# Patient Record
Sex: Female | Born: 1965 | Race: Black or African American | Hispanic: No | Marital: Married | Smoking: Never smoker
Health system: Southern US, Community
[De-identification: ages and names within clinical notes are randomized; demographics above are authoritative.]

## PROBLEM LIST (undated history)

## (undated) DIAGNOSIS — J45909 Unspecified asthma, uncomplicated: Secondary | ICD-10-CM

## (undated) HISTORY — PX: HERNIA REPAIR: SHX51

## (undated) HISTORY — DX: Unspecified asthma, uncomplicated: J45.909

## (undated) HISTORY — PX: DILATION AND CURETTAGE, DIAGNOSTIC / THERAPEUTIC: SUR384

---

## 1999-05-20 ENCOUNTER — Emergency Department (HOSPITAL_COMMUNITY): Admission: EM | Admit: 1999-05-20 | Discharge: 1999-05-20 | Payer: Self-pay | Admitting: *Deleted

## 1999-06-20 ENCOUNTER — Encounter: Payer: Self-pay | Admitting: Emergency Medicine

## 1999-06-20 ENCOUNTER — Inpatient Hospital Stay (HOSPITAL_COMMUNITY): Admission: EM | Admit: 1999-06-20 | Discharge: 1999-06-22 | Payer: Self-pay | Admitting: Emergency Medicine

## 1999-06-21 ENCOUNTER — Encounter (HOSPITAL_BASED_OUTPATIENT_CLINIC_OR_DEPARTMENT_OTHER): Payer: Self-pay | Admitting: Internal Medicine

## 2001-03-04 ENCOUNTER — Other Ambulatory Visit: Admission: RE | Admit: 2001-03-04 | Discharge: 2001-03-04 | Payer: Self-pay | Admitting: Family Medicine

## 2004-01-07 ENCOUNTER — Encounter (INDEPENDENT_AMBULATORY_CARE_PROVIDER_SITE_OTHER): Payer: Self-pay | Admitting: Specialist

## 2004-01-07 ENCOUNTER — Ambulatory Visit (HOSPITAL_COMMUNITY): Admission: AD | Admit: 2004-01-07 | Discharge: 2004-01-07 | Payer: Self-pay | Admitting: Gynecology

## 2004-01-07 ENCOUNTER — Encounter: Payer: Self-pay | Admitting: Emergency Medicine

## 2004-02-26 ENCOUNTER — Ambulatory Visit: Payer: Self-pay | Admitting: *Deleted

## 2004-03-07 ENCOUNTER — Ambulatory Visit: Payer: Self-pay | Admitting: Family Medicine

## 2004-04-10 ENCOUNTER — Ambulatory Visit: Payer: Self-pay | Admitting: Family Medicine

## 2004-05-22 ENCOUNTER — Ambulatory Visit: Payer: Self-pay | Admitting: Family Medicine

## 2004-05-23 ENCOUNTER — Emergency Department (HOSPITAL_COMMUNITY): Admission: EM | Admit: 2004-05-23 | Discharge: 2004-05-23 | Payer: Self-pay | Admitting: Emergency Medicine

## 2004-07-04 ENCOUNTER — Ambulatory Visit (HOSPITAL_COMMUNITY): Admission: RE | Admit: 2004-07-04 | Discharge: 2004-07-04 | Payer: Self-pay | Admitting: Obstetrics & Gynecology

## 2004-07-11 ENCOUNTER — Ambulatory Visit (HOSPITAL_COMMUNITY): Admission: RE | Admit: 2004-07-11 | Discharge: 2004-07-11 | Payer: Self-pay | Admitting: Obstetrics & Gynecology

## 2004-07-12 ENCOUNTER — Ambulatory Visit (HOSPITAL_COMMUNITY): Admission: RE | Admit: 2004-07-12 | Discharge: 2004-07-12 | Payer: Self-pay | Admitting: Obstetrics & Gynecology

## 2004-07-12 ENCOUNTER — Ambulatory Visit: Payer: Self-pay | Admitting: Obstetrics & Gynecology

## 2004-07-12 ENCOUNTER — Encounter (INDEPENDENT_AMBULATORY_CARE_PROVIDER_SITE_OTHER): Payer: Self-pay | Admitting: *Deleted

## 2004-07-27 ENCOUNTER — Inpatient Hospital Stay (HOSPITAL_COMMUNITY): Admission: AD | Admit: 2004-07-27 | Discharge: 2004-07-27 | Payer: Self-pay | Admitting: Obstetrics and Gynecology

## 2004-07-30 ENCOUNTER — Ambulatory Visit: Payer: Self-pay | Admitting: Obstetrics & Gynecology

## 2004-08-13 ENCOUNTER — Ambulatory Visit: Payer: Self-pay | Admitting: *Deleted

## 2004-09-19 ENCOUNTER — Inpatient Hospital Stay (HOSPITAL_COMMUNITY): Admission: AD | Admit: 2004-09-19 | Discharge: 2004-09-19 | Payer: Self-pay | Admitting: Internal Medicine

## 2004-11-14 ENCOUNTER — Inpatient Hospital Stay (HOSPITAL_COMMUNITY): Admission: AD | Admit: 2004-11-14 | Discharge: 2004-11-14 | Payer: Self-pay | Admitting: Obstetrics & Gynecology

## 2004-12-17 ENCOUNTER — Inpatient Hospital Stay (HOSPITAL_COMMUNITY): Admission: AD | Admit: 2004-12-17 | Discharge: 2004-12-17 | Payer: Self-pay | Admitting: Obstetrics

## 2005-03-04 ENCOUNTER — Inpatient Hospital Stay (HOSPITAL_COMMUNITY): Admission: AD | Admit: 2005-03-04 | Discharge: 2005-03-07 | Payer: Self-pay | Admitting: Obstetrics & Gynecology

## 2005-05-14 ENCOUNTER — Ambulatory Visit (HOSPITAL_COMMUNITY): Admission: RE | Admit: 2005-05-14 | Discharge: 2005-05-14 | Payer: Self-pay | Admitting: Obstetrics

## 2005-06-12 ENCOUNTER — Inpatient Hospital Stay (HOSPITAL_COMMUNITY): Admission: AD | Admit: 2005-06-12 | Discharge: 2005-06-12 | Payer: Self-pay | Admitting: Obstetrics & Gynecology

## 2005-06-18 ENCOUNTER — Inpatient Hospital Stay (HOSPITAL_COMMUNITY): Admission: AD | Admit: 2005-06-18 | Discharge: 2005-06-18 | Payer: Self-pay | Admitting: Obstetrics

## 2005-07-11 ENCOUNTER — Inpatient Hospital Stay (HOSPITAL_COMMUNITY): Admission: AD | Admit: 2005-07-11 | Discharge: 2005-07-11 | Payer: Self-pay | Admitting: Obstetrics & Gynecology

## 2005-07-16 ENCOUNTER — Inpatient Hospital Stay (HOSPITAL_COMMUNITY): Admission: AD | Admit: 2005-07-16 | Discharge: 2005-07-16 | Payer: Self-pay | Admitting: Obstetrics

## 2005-07-18 ENCOUNTER — Observation Stay (HOSPITAL_COMMUNITY): Admission: AD | Admit: 2005-07-18 | Discharge: 2005-07-19 | Payer: Self-pay | Admitting: *Deleted

## 2005-07-24 ENCOUNTER — Inpatient Hospital Stay (HOSPITAL_COMMUNITY): Admission: AD | Admit: 2005-07-24 | Discharge: 2005-07-26 | Payer: Self-pay | Admitting: Obstetrics & Gynecology

## 2005-11-27 ENCOUNTER — Encounter: Admission: RE | Admit: 2005-11-27 | Discharge: 2005-11-27 | Payer: Self-pay | Admitting: Internal Medicine

## 2006-04-14 ENCOUNTER — Inpatient Hospital Stay (HOSPITAL_COMMUNITY): Admission: AD | Admit: 2006-04-14 | Discharge: 2006-04-14 | Payer: Self-pay | Admitting: Obstetrics

## 2006-05-14 ENCOUNTER — Inpatient Hospital Stay (HOSPITAL_COMMUNITY): Admission: AD | Admit: 2006-05-14 | Discharge: 2006-05-14 | Payer: Self-pay | Admitting: Obstetrics & Gynecology

## 2006-09-10 ENCOUNTER — Inpatient Hospital Stay (HOSPITAL_COMMUNITY): Admission: AD | Admit: 2006-09-10 | Discharge: 2006-09-10 | Payer: Self-pay | Admitting: Obstetrics & Gynecology

## 2006-11-29 ENCOUNTER — Inpatient Hospital Stay (HOSPITAL_COMMUNITY): Admission: AD | Admit: 2006-11-29 | Discharge: 2006-12-01 | Payer: Self-pay | Admitting: Obstetrics

## 2007-01-26 ENCOUNTER — Ambulatory Visit (HOSPITAL_COMMUNITY): Admission: RE | Admit: 2007-01-26 | Discharge: 2007-01-26 | Payer: Self-pay | Admitting: Obstetrics

## 2007-02-23 ENCOUNTER — Encounter: Admission: RE | Admit: 2007-02-23 | Discharge: 2007-04-28 | Payer: Self-pay | Admitting: Obstetrics & Gynecology

## 2007-04-13 ENCOUNTER — Ambulatory Visit: Payer: Self-pay | Admitting: Family Medicine

## 2009-05-02 ENCOUNTER — Ambulatory Visit (HOSPITAL_COMMUNITY): Admission: RE | Admit: 2009-05-02 | Discharge: 2009-05-02 | Payer: Self-pay | Admitting: Obstetrics

## 2010-04-24 ENCOUNTER — Emergency Department (HOSPITAL_COMMUNITY)
Admission: EM | Admit: 2010-04-24 | Discharge: 2010-04-24 | Payer: Self-pay | Source: Home / Self Care | Admitting: Emergency Medicine

## 2010-05-19 ENCOUNTER — Encounter: Payer: Self-pay | Admitting: Obstetrics & Gynecology

## 2010-09-13 NOTE — Consult Note (Signed)
NAMEFRANCI, [name]           ACCOUNT NO.:  [id number]   MEDICAL RECORD NO.:  [id number]          PATIENT TYPE:  OUT   LOCATION:  ULT                           [facility]   PHYSICIAN:  Lesly Dukes, M.D. DATE OF BIRTH:  09-30-1965   DATE OF CONSULTATION:  07/11/2004  DATE OF DISCHARGE:                                   CONSULTATION   FOLLOW-UP CONSULT:  On July 08, 2004 the patient was scheduled for DVC for  twin IUP missed AB. However, the patient did not show for appointment. When  she was called she stated that she did not believe that the fetuses were  dead and wanted a second opinion. I called Dr. Coral Ceo of [facility], who  agreed to see her. He saw her later that afternoon and agreed with my  clinical diagnosis that she had a missed AB. However, for reassurance the  patient was scheduled for two beta hCG quantitatives so she could see the  falling quantitatives, and also for repeat ultrasound on March 16. Her beta  hCG on March 13 was around 33,000 and on March 16 was 25,000. A repeat  ultrasound on March 16 again showed an 8-week twin IUP fetal demise. The  patient at this point has accepted that she has a fetal demise and will  agree for DVC. She is scheduled for July 12, 2004 at 2:30 p.m. She will be  n.p.o. after midnight.      KHL/MEDQ  D:  07/11/2004  T:  07/11/2004  Job:  [id number]

## 2010-09-13 NOTE — Op Note (Signed)
NAMEJASSMINE, Renee           ACCOUNT NO.:  [id number]   MEDICAL RECORD NO.:  [id number]          PATIENT TYPE:  AMB   LOCATION:  SDC                           [facility]   PHYSICIAN:  Lesly Dukes, M.D. DATE OF BIRTH:  12/23/1965   DATE OF PROCEDURE:  07/12/2004  DATE OF DISCHARGE:                                 OPERATIVE REPORT   PREOPERATIVE DIAGNOSIS:  A 45 year old G3, para 1-0-1-1, with an eight-week  twin intrauterine pregnancy missed abortion.   POSTOPERATIVE DIAGNOSIS:  A 45 year old G3, para 1-0-1-1, with an eight-week  twin intrauterine pregnancy missed abortion.   PROCEDURE:  Dilation and vacuum curettage.   SURGEON:  Lesly Dukes, M.D.   ASSISTANT:  None.   ANESTHESIA:  General.   SPECIMENS:  Endometrial curettings.   ESTIMATED BLOOD LOSS:  100.   COMPLICATIONS:  None.   FINDINGS:  Approximately 10-week sized uterus.  No adnexal masses.  Products  of conception sent for genetics at [facility].   PROCEDURE:  After informed consent was obtained, the patient was taken to  the operating room, where general anesthesia was induced.  The patient was  placed in dorsal lithotomy position and prepared and draped in normal  sterile fashion.  The bladder was in-and-out catheterized.  Bimanual exam  revealed an approximately 10-week mobile uterus, no adnexal masses.  A  bivalve speculum was placed into the patient's vagina.  The anterior lip of  the cervix was grasped with a single-tooth tenaculum.  The uterus was then  sounded to approximately 11 cm.  The cervix was then gently dilated with the  Hegar dilators to a #10.  A #9 curved curette was introduced into the uterus  and gentle suction curettage was performed.  The suction curette was removed  from the uterus and a sharp curette was gently inserted into the uterus.  Sharp curettage was performed.  All four walls of the uterus were noted to  have a good cry.  The sharp curette was removed and  the suction curette was  introduced in the uterus one last time to ensure that all products of  conception were removed from the uterus.  All instruments were removed from  the  patient's vagina, and the cervix was noted to be hemostatic.  Bimanual exam  revealed an approximately six-week size uterus at the end of the exam.  The  patient tolerated the procedure well.  The sponge, lap, instrument and  needle count were correct x2, and the patient went to recovery in stable  condition.      KHL/MEDQ  D:  07/12/2004  T:  07/12/2004  Job:  [id number]

## 2010-09-13 NOTE — H&P (Signed)
NAME:  [name], [name]                          ACCOUNT NO.:  [id number]   MEDICAL RECORD NO.:  [id number]                   PATIENT TYPE:  AMB   LOCATION:  SDC                                  [facility]   PHYSICIAN:  Juan H. Lily Peer, M.D.             DATE OF BIRTH:  05/15/1965   DATE OF ADMISSION:  01/07/2004  DATE OF DISCHARGE:                                HISTORY & PHYSICAL   CHIEF COMPLAINT:  Abdominal cramping and vaginal bleeding.   HISTORY OF PRESENT ILLNESS:  A 45 year old, gravida 3, para 1, AB 1, last  menstrual period December 31, 2003.  Not using any form of contraception.  Currently 5-1/[redacted] weeks pregnant.  She found out today that she was pregnancy  when she presented to [facility] this afternoon complaining of  cramping and vaginal bleeding starting at 7 o'clock this morning, with  passage of large blood clots, and the bleeding would not stop.  Her blood  pressure was 113/63, pulse 84, respirations 20, temperature 98.3.  Hemoglobin and hematocrit were 12.3 and hematocrit 36.3.  Platelet count  239,000.  White blood count 8.9.  Blood type was AB positive, and  quantitative beta hCG was 3162.  Ultrasound demonstrated diffusely  heterogenous endometrium.  No intrauterine pregnancy seen.  A 2.6 cm left  ovarian cyst, but no other adnexal masses or free fluid were noted,  consistent with probable incomplete AB.   PAST MEDICAL HISTORY:  She denies any allergies.  She has had one term  normal spontaneous vaginal delivery, one elective termination, and now this  pregnancy.  The patient has a history of asthma, for which she takes Advair  daily, one puff in the morning, Singulair one tablet once a day, Zyrtec once  tablet daily, and albuterol inhaler p.r.n.; she took 2 puffs earlier today.  The patient has been followed [employer] for her medical care needs.   PHYSICAL EXAMINATION:  VITAL SIGNS:  As described above.  HEENT:  Unremarkable.  NECK:  Supple.   Trachea midline.  No carotid bruits, no thyromegaly.  LUNGS:  Clear to auscultation without any rhonchi or wheezes.  HEART:  Regular rate and rhythm.  No murmurs or gallops.  BREAST EXAM:  Not done.  ABDOMEN:  Soft, nontender, without rebound or guarding.  PELVIC:  Speculum was placed into the vaginal vault.  Large quantities of  blood and blood clots were present.  The cervix was slightly dilated, 1 cm,  with clots and tissue protruding from the os.  Bimanual examination - uterus  approximately 6-8 weeks size.  No palpable masses or tenderness.   ASSESSMENT:  A 45 year old, gravida 3, para 1, AB 1, with 5-1/2 weeks  estimated gestational age with apparent incomplete AB.  Hemodynamically  stable.  Has an IV in place.  The patient will be taken to the operating  room for an emergency D&E.  Risks, benefits, and pros  and cons of the  procedure to include infection, bleeding, or perforation through the  instrumentation.  The patient will receive 2 gm of Cefotan for prophylaxis.  Her blood type is AB positive.  She will then have a quantitative beta hCG  follow up in the middle of the week, and follow up at the  [facility] clinic to make quantities continue to decrease to zero,  and then subsequently use some form of barrier contraception for 3-4 months  before she attempts to get pregnant again, and will also place her on  prenatal vitamins, as well.                                               Juan H. Lily Peer, M.D.    JHF/MEDQ  D:  01/07/2004  T:  01/07/2004  Job:  [id number]

## 2010-09-13 NOTE — Group Therapy Note (Signed)
NAMETACEY, DIMAGGIO           ACCOUNT NO.:  [id number]   MEDICAL RECORD NO.:  [id number]          PATIENT TYPE:  WOC   LOCATION:  [facility]                   [facility]AC[facility]:  [facility]   PHYSICIAN:  Ellis Parents, MD    DATE OF BIRTH:  Oct 22, 1965   DATE OF SERVICE:  08/13/2004                                    CLINIC NOTE   CHIEF COMPLAINT:  Left Bartholin's cyst.   HISTORY OF PRESENT ILLNESS:  1.  The patient reports that approximately 1 month ago a Bartholin's cyst      arose on her left side. She was seen in the MAU and was given      antibiotics at that time and told to take sitz baths but no drainage has      happened from there. She has not had symptoms - no pain or tenderness or      drainage from the site. She was seen and evaluated approximately 2 weeks      ago for the same and was told to return to clinic in a couple of weeks.  2.  URI symptoms. The patient reports that for a couple of days she has had      nasal congestion and rhinorrhea, as well as a cough productive of      darkish sputum. She reports a history of asthma.  3.  Missed abortion. The patient had a missed abortion and underwent a      __________ July 12, 2004. She wanted to know if the genetic results      were back today.   PHYSICAL EXAMINATION:  VITAL SIGNS:  Temperature 97.6, pulse 89, blood  pressure 130/81, weight 185.9, height 5 feet 4 inches.  LUNGS:  She has good air movement bilaterally; however, she does have  scattered expiratory wheezes.  GENITOURINARY:  She has normal external female genitalia. Her vagina has  normal rugations and her cervix is normal in appearance without lesions. She  is noted to have a 3 x 5 cm Bartholin's cyst on the left. There is no  induration, no tenderness noted.   ASSESSMENT AND PLAN:  1.  Bartholin's cyst. The patient was given an option to think about having      a Ward catheter placed or continue to go on without treatment as she is      asymptomatic. She is  going to think about this and make an appointment      to return for placement for Ward catheter if she decides on this.  2.  Upper respiratory infection with asthma exacerbation. I have given her a      prescription today for albuterol to be taken q.4-6h. p.r.n. while she is      symptomatic, as well as Rondec DM. She has been afebrile without chills      so I do not think that she currently has a bacterial infection that will      require antibiotics.  3.  Missed abortion, awaiting genetic test results. The patient will return      to the clinic for review of these genetic results prior to attempting  subsequent pregnancy. These simply have not arrived from the pathologist      yet.      JT/MEDQ  D:  08/13/2004  T:  08/13/2004  Job:  [id number]

## 2010-09-13 NOTE — Consult Note (Signed)
[name], [name]           ACCOUNT NO.:  [id number]   MEDICAL RECORD NO.:  [id number]          PATIENT TYPE:  WOC   LOCATION:  WOC                          FACILITY:  [facility]   PHYSICIAN:  Lesly Dukes, M.D. DATE OF BIRTH:  04/16/1966   DATE OF CONSULTATION:  07/04/2004  DATE OF DISCHARGE:                                   CONSULTATION   HISTORY OF PRESENT ILLNESS:  The patient is a 45 year old G4, para 1, 0-3-1  with LMP of December 2005 who presents for dating ultrasound.  The patient  was found to have monodi twins on ultrasound measuring approximately 8  weeks.  However, neither had a heartbeat.  She had been pregnant  approximately 10 weeks at this point.  We had an extensive conversation  about what causes miscarriages and how to remedy the situation.  The patient  at that time was in agreement for a D&C as her uterus was too big for a  medical abortion.  It was also agreed that we would send her products of  conception for chromosomal abnormalities as this is the most likely cause of  her miscarriage.   PAST MEDICAL HISTORY:  1.  Asthma with her last hospitalization in 2001.  The patient has never      been intubated.  2.  Seasonal allergies.   PAST SURGICAL HISTORY:  Hernia repair at the umbilicus at 45 years old.   ALLERGIES:  None.   MEDICATIONS:  Advair, albuterol, Singulair, and Zyrtec.   REVIEW OF SYMPTOMS:  No vaginal bleeding, no cramping.  No chest pain,  shortness of breath, nausea, vomiting, diarrhea, or change in bladder  habits.   PHYSICAL EXAMINATION:  GENERAL APPEARANCE:  Well-nourished, well-developed  in no apparent distress.  HEENT:  Normocephalic, atraumatic.  NECK:  Neck supple.  No masses.  LUNGS:  Lungs clear to auscultation bilaterally.  HEART:  Regular rate and rhythm.  ABDOMEN:  Abdomen soft, nontender, nondistended.  PELVIC:  Genitalia Tanner V.  Vagina pink.  Cervix closed, nontender.  Uterus 10 weeks size.  EXTREMITIES:   Extremities nontender.  SKIN:  No rashes.   ASSESSMENT AND PLAN:  A 45 year old para 1, 0-3-1, with missed abortion of  twin intrauterine pregnancy.  The patient is for [facility] on Monday, March 13.      KHL/MEDQ  D:  07/11/2004  T:  07/11/2004  Job:  [id number]

## 2010-09-13 NOTE — Op Note (Signed)
NAME:  [name], GALATI                          ACCOUNT NO.:  [id number]   MEDICAL RECORD NO.:  [id number]                   PATIENT TYPE:  AMB   LOCATION:  SDC                                  [facility]   PHYSICIAN:  Juan H. Lily Peer, M.D.             DATE OF BIRTH:  Dec 28, 1965   DATE OF PROCEDURE:  01/07/2004  DATE OF DISCHARGE:                                 OPERATIVE REPORT   PREOPERATIVE DIAGNOSIS:  First trimester abortion.   POSTOPERATIVE DIAGNOSIS:  First trimester abortion.   OPERATION PERFORMED:  Emergency dilation and evacuation.   SURGEON:  Juan H. Lily Peer, M.D.   ANESTHESIA:  MAC and paracervical block with 2% Xylocaine with 1:100,000  epinephrine.   DESCRIPTION OF PROCEDURE:  After the patient was adequately counseled, she  was taken to the operating room where she underwent intravenous sedation.  She was placed in the high lithotomy position.  The vagina and perineum were  prepped and draped in the usual sterile fashion.  A Graves speculum was  introduced into the vaginal vault.  The vaginal and cervical region was once  again cleansed with Betadine solution. The patient had received a gram of  Cefotan prophylactically.  2% Xylocaine with 1:100,000 epinephrine was  infiltrated into the cervical stroma at the 2, 4, 8 and 10 o'clock position.  The uterus sounded to approximately 8 cm and the cervix was dilated to size  23 Pratt dilator.  An 8 mm suction curet was then introduced into the  intrauterine cavity to suction and remove the products of conception. This  was interchanged with Hunter curette to completely evacuate the intrauterine  cavity of its contents.  A moderate amount of tissue was obtained and was  submitted for histologic evaluation.  The patient's blood type was AB  positive, IV fluid was 600 mL and estimated blood loss 100 to 150 mL.  The  patient will be followed in gyn clinic at [facility] on Tuesday,  September 27 where she has an  appointment at 2:30 p.m. She will return to  [facility] on September 14 for a quantitative beta hCG.  She was given  a prescription for Motrin 800 mg to take by mouth three times daily as  needed and she was given a prescription for [employer] prenatal vitamins to  start taking before she attempts to get pregnant in three months or so.                                               Juan H. Lily Peer, M.D.    JHF/MEDQ  D:  01/07/2004  T:  01/08/2004  Job:  [id number]

## 2010-09-13 NOTE — Group Therapy Note (Signed)
NAMEGINNI, EICHLER           ACCOUNT NO.:  [id number]   MEDICAL RECORD NO.:  [id number]          PATIENT TYPE:  WOC   LOCATION:  [facility]                   [facility]   PHYSICIAN:  Elsie Lincoln, MD      DATE OF BIRTH:  Jun 21, 1965   DATE OF SERVICE:                                    CLINIC NOTE   HISTORY OF PRESENT ILLNESS:  The patient is a 46 year old female who was  diagnosed with approximately 7-week twin intrauterine demise.  She has been  followed [facility] the health department.  She did not know she had twins.  I spoke  with her on July 08, 2004 when she did not show up for a DVC.  She wanted a  second opinion.  She went to see Dr. Coral Ceo  who concurred with my  diagnosis.  We did repeat an ultrasound on July 11, 2004, and did 2 betas  to show the patient that her beta hCG level was falling.  It went from  33,000 to 25,000, and there was no growth on the ultrasound, and no cardiac  activity.  The patient went for DVC for July 12, 2004.  The procedure was  uncomplicated.  POC __________  pathologist was noted.  Tissues were sent to  [facility] for genetics.  Today, the patient has only complaints of a left  Bartholin cyst.  She went to the MAU on Saturday, and got this diagnosis.  She was sent home for 7 days' of Keflex and sitz baths.  It is not infected  at this time.  However, it is mildly tender to palpation.  The patient has  not had intercourse yet, and does not believe it is possible with this  Bartholin cyst.  The patient has had no vaginal bleeding for the past week.   PHYSICAL EXAMINATION:  GENITOURINARY:  Tanner 5.  Vagina pink, normal rugae.  Vulva:  A 4.0 x 3.0 Bartholin cyst.  Cervix is closed, nontender.   ASSESSMENT AND PLAN:  9.  A 45 year old female status post DVC for twin missed abortion.  The      patient also has left Bartholin's.  At this time, we will continue sitz      baths and Keflex, and see if it resolves.  The patient will return in  2      weeks if it has not resolved for possible insertion of Word's catheter.  2.  We called [facility], and the patient's genetics will be back late this      week.  The patient is supposed to call next week so we can give further      results.      KL/MEDQ  D:  07/30/2004  T:  07/30/2004  Job:  [id number]

## 2010-09-13 NOTE — Discharge Summary (Signed)
[name], [name]           ACCOUNT NO.:  [id number]   MEDICAL RECORD NO.:  [id number]          PATIENT TYPE:  INP   LOCATION:  9304                          [facility]   PHYSICIAN:  Roseanna Rainbow, M.D.DATE OF BIRTH:  12-Jul-1965   DATE OF ADMISSION:  03/04/2005  DATE OF DISCHARGE:  03/07/2005                                 DISCHARGE SUMMARY   CHIEF COMPLAINT:  The patient is a 45 year old gravida 5, para 1, 0, 3, 1,  who presents at 20-2/7 weeks complaining of wheezing for several days.   HISTORY OF PRESENT ILLNESS:  The patient has a history of asthma requiring  multiple medications. She also complains of a productive cough and other  upper respiratory tract infection symptoms.   PRENATAL COURSE:  Pregnancy complications or risks - please see the above.   MEDICATIONS:  Advair, albuterol inhaler, cough suppressant, prenatal  vitamins, Singulair SR, Zyrtec.   ALLERGIES:  No known drug allergies.   PAST OBSTETRICAL HISTORY:  She has had a voluntary termination of pregnancy,  two spontaneous abortions and one spontaneous vaginal delivery.   PAST MEDICAL HISTORY:  Please see the above.   PHYSICAL EXAMINATION:  VITAL SIGNS:  Temperature 97.9, pulse 93, respiratory  rate 26, blood pressure 123/60, O2 saturations 99% on room air.  GENERAL:  No apparent distress.  LUNGS:  With wheezing at the bases, expiratory.  CERVICAL EXAM:  Deferred.   LABORATORY DATA:  Peak flow initially 75, post nebulizer treatment was 250.   ASSESSMENT:  Intrauterine pregnancy at 20+ weeks with acute asthma  exacerbation.  Upper respiratory tract infection.   PLAN:  Admission, nebulizers, antibiotics, steroids and a chest x-ray.   HOSPITAL COURSE:  The patient was admitted and started on broad spectrum  antibiotics.  Respiratory treatments and parenteral steroids were continued.  An oral steroid taper was started on November 9. She was discharged to home  on March 07, 2005. She was  to complete a course of steroids and oral  antibiotics.   DISCHARGE DIAGNOSIS:  1.  Intrauterine pregnancy at 20+ weeks.  2.  Acute asthma exacerbation.   Condition stable. Diet regular. Activity - modified bedrest.   MEDICATIONS:  Albuterol, Advair, Prednisone, Zithromax, Singulair, Zyrtec  and cough suppressant.   DISPOSITION:  The patient was to follow up in the office in 1 week.      Roseanna Rainbow, M.D.  Electronically Signed     LAJ/MEDQ  D:  04/04/2005  T:  04/04/2005  Job:  [id number]

## 2011-02-10 LAB — CBC
HCT: 35.4 — ABNORMAL LOW
Hemoglobin: 11.9 — ABNORMAL LOW
MCHC: 33.4
MCHC: 33.6
MCV: 93.3
Platelets: 161
Platelets: 178
RDW: 15.1 — ABNORMAL HIGH
WBC: 13.2 — ABNORMAL HIGH
WBC: 14.2 — ABNORMAL HIGH

## 2011-02-10 LAB — RPR: RPR Ser Ql: NONREACTIVE

## 2011-03-10 ENCOUNTER — Other Ambulatory Visit: Payer: Self-pay | Admitting: Obstetrics

## 2011-03-10 DIAGNOSIS — Z1231 Encounter for screening mammogram for malignant neoplasm of breast: Secondary | ICD-10-CM

## 2011-03-10 DIAGNOSIS — N83209 Unspecified ovarian cyst, unspecified side: Secondary | ICD-10-CM

## 2011-03-13 ENCOUNTER — Ambulatory Visit (HOSPITAL_COMMUNITY)
Admission: RE | Admit: 2011-03-13 | Discharge: 2011-03-13 | Disposition: A | Discharge status: Home / Self Care | Payer: Managed Care, Other (non HMO) | Source: Ambulatory Visit | Attending: Obstetrics | Admitting: Obstetrics

## 2011-03-13 DIAGNOSIS — N949 Unspecified condition associated with female genital organs and menstrual cycle: Secondary | ICD-10-CM | POA: Insufficient documentation

## 2011-03-13 DIAGNOSIS — D259 Leiomyoma of uterus, unspecified: Secondary | ICD-10-CM | POA: Insufficient documentation

## 2011-03-13 DIAGNOSIS — N83209 Unspecified ovarian cyst, unspecified side: Secondary | ICD-10-CM

## 2011-05-05 ENCOUNTER — Ambulatory Visit (HOSPITAL_COMMUNITY): Payer: Self-pay

## 2011-05-30 ENCOUNTER — Ambulatory Visit (HOSPITAL_COMMUNITY)
Admission: RE | Admit: 2011-05-30 | Discharge: 2011-05-30 | Disposition: A | Discharge status: Home / Self Care | Payer: Managed Care, Other (non HMO) | Source: Ambulatory Visit | Attending: Obstetrics | Admitting: Obstetrics

## 2011-05-30 DIAGNOSIS — Z1231 Encounter for screening mammogram for malignant neoplasm of breast: Secondary | ICD-10-CM | POA: Insufficient documentation

## 2011-08-15 ENCOUNTER — Ambulatory Visit: Payer: Managed Care, Other (non HMO)

## 2011-08-15 ENCOUNTER — Ambulatory Visit (INDEPENDENT_AMBULATORY_CARE_PROVIDER_SITE_OTHER): Payer: Managed Care, Other (non HMO) | Admitting: Family Medicine

## 2011-08-15 DIAGNOSIS — J029 Acute pharyngitis, unspecified: Secondary | ICD-10-CM

## 2011-08-15 DIAGNOSIS — J45901 Unspecified asthma with (acute) exacerbation: Secondary | ICD-10-CM

## 2011-08-15 LAB — POCT CBC
Granulocyte percent: 67.6 %G (ref 37–80)
HCT, POC: 37.9 % (ref 37.7–47.9)
Hemoglobin: 12.1 g/dL — AB (ref 12.2–16.2)
Lymph, poc: 1.4 (ref 0.6–3.4)
MCH, POC: 29.2 pg (ref 27–31.2)
MCHC: 31.9 g/dL (ref 31.8–35.4)
MCV: 91.3 fL (ref 80–97)
MID (cbc): 0.6 (ref 0–0.9)
MPV: 9.4 fL (ref 0–99.8)
POC Granulocyte: 4 (ref 2–6.9)
POC LYMPH PERCENT: 23 %L (ref 10–50)
POC MID %: 9.4 %M (ref 0–12)
Platelet Count, POC: 172 10*3/uL (ref 142–424)
RBC: 4.15 M/uL (ref 4.04–5.48)
RDW, POC: 14 %
WBC: 5.9 10*3/uL (ref 4.6–10.2)

## 2011-08-15 LAB — POCT RAPID STREP A (OFFICE): Rapid Strep A Screen: NEGATIVE

## 2011-08-15 MED ORDER — METHYLPREDNISOLONE 4 MG PO KIT: PACK | ORAL | Status: AC

## 2011-08-15 MED ORDER — IPRATROPIUM BROMIDE 0.02 % IN SOLN
0.5000 mg | Freq: Once | RESPIRATORY_TRACT | Status: AC
  Administered 2011-08-15: 0.5 mg via RESPIRATORY_TRACT

## 2011-08-15 MED ORDER — ALBUTEROL SULFATE (2.5 MG/3ML) 0.083% IN NEBU
2.5000 mg | INHALATION_SOLUTION | Freq: Once | RESPIRATORY_TRACT | Status: AC
  Administered 2011-08-15: 2.5 mg via RESPIRATORY_TRACT

## 2011-08-15 NOTE — Progress Notes (Signed)
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  Her children had strep earlier in the week.  O:  Mildly dyspneic, alert, cooperative Skin:  Clear HEENT:  Clear Chest:  Decreased BS with diffuse wheezes. Heart:  Reg without tachycardia. [facility] reading (PRIMARY) by  Dr. Milus Glazier:  CxR.  No acute infiltrate Results for orders placed in visit on 08/15/11  POCT CBC      Component Value Range   WBC 5.9  4.6 - 10.2 (K/uL)   Lymph, poc 1.4  0.6 - 3.4    POC LYMPH PERCENT 23.0  10 - 50 (%L)   MID (cbc) 0.6  0 - 0.9    POC MID % 9.4  0 - 12 (%M)   POC Granulocyte 4.0  2 - 6.9    Granulocyte percent 67.6  37 - 80 (%G)   RBC 4.15  4.04 - 5.48 (M/uL)   Hemoglobin 12.1 (*) 12.2 - 16.2 (g/dL)   HCT, POC 16.1  09.6 - 47.9 (%)   MCV 91.3  80 - 97 (fL)   MCH, POC 29.2  27 - 31.2 (pg)   MCHC 31.9  31.8 - 35.4 (g/dL)   RDW, POC 04.5     Platelet Count, POC 172  142 - 424 (K/uL)   MPV 9.4  0 - 99.8 (fL)  POCT RAPID STREP A (OFFICE)      Component Value Range   Rapid Strep A Screen Negative  Negative   peak flow 150   A:

## 2011-08-15 NOTE — Patient Instructions (Signed)
Asthma, Adult Asthma is caused by narrowing of the air passages in the lungs. It may be triggered by pollen, dust, animal dander, molds, some foods, respiratory infections, exposure to smoke, exercise, emotional stress or other allergens (things that cause allergic reactions or allergies). Repeat attacks are common. HOME CARE INSTRUCTIONS   Use prescription medications as ordered by your caregiver.   Avoid pollen, dust, animal dander, molds, smoke and other things that cause attacks at home and at work.   You may have fewer attacks if you decrease dust in your home. Electrostatic air cleaners may help.   It may help to replace your pillows or mattress with materials less likely to cause allergies.   Talk to your caregiver about an action plan for managing asthma attacks at home, including, the use of a peak flow meter which measures the severity of your asthma attack. An action plan can help minimize or stop the attack without having to seek medical care.   If you are not on a fluid restriction, drink 8 to 10 glasses of water each day.   Always have a plan prepared for seeking medical attention, including, calling your physician, accessing local emergency care, and calling 911 (in the [place] for a severe attack.   Discuss possible exercise routines with your caregiver.   If animal dander is the cause of asthma, you may need to get rid of pets.  SEEK MEDICAL CARE IF:   You have wheezing and shortness of breath even if taking medicine to prevent attacks.   You have muscle aches, chest pain or thickening of sputum.   Your sputum changes from clear or white to yellow, green, gray, or bloody.   You have any problems that may be related to the medicine you are taking (such as a rash, itching, swelling or trouble breathing).  SEEK IMMEDIATE MEDICAL CARE IF:   Your usual medicines do not stop your wheezing or there is increased coughing and/or shortness of breath.   You have increased  difficulty breathing.   You have a fever.  MAKE SURE YOU:   Understand these instructions.   Will watch your condition.   Will get help right away if you are not doing well or get worse.  Document Released: 04/14/2005 Document Revised: 04/03/2011 Document Reviewed: 12/01/2007 [employer] Patient Information 2012 [employer].         Asthma Attack Prevention HOW CAN ASTHMA BE PREVENTED? Currently, there is no way to prevent asthma from starting. However, you can take steps to control the disease and prevent its symptoms after you have been diagnosed. Learn about your asthma and how to control it. Take an active role to control your asthma by working with your caregiver to create and follow an asthma action plan. An asthma action plan guides you in taking your medicines properly, avoiding factors that make your asthma worse, tracking your level of asthma control, responding to worsening asthma, and seeking emergency care when needed. To track your asthma, keep records of your symptoms, check your peak flow number using a peak flow meter (handheld device that shows how well air moves out of your lungs), and get regular asthma checkups.  Other ways to prevent asthma attacks include:  Use medicines as your caregiver directs.   Identify and avoid things that make your asthma worse (as much as you can).   Keep track of your asthma symptoms and level of control.   Get regular checkups for your asthma.   With your caregiver,   write a detailed plan for taking medicines and managing an asthma attack. Then be sure to follow your action plan. Asthma is an ongoing condition that needs regular monitoring and treatment.   Identify and avoid asthma triggers. A number of outdoor allergens and irritants (pollen, mold, cold air, air pollution) can trigger asthma attacks. Find out what causes or makes your asthma worse, and take steps to avoid those triggers (see below).   Monitor your breathing.  Learn to recognize warning signs of an attack, such as slight coughing, wheezing or shortness of breath. However, your lung function may already decrease before you notice any signs or symptoms, so regularly measure and record your peak airflow with a home peak flow meter.   Identify and treat attacks early. If you act quickly, you're less likely to have a severe attack. You will also need less medicine to control your symptoms. When your peak flow measurements decrease and alert you to an upcoming attack, take your medicine as instructed, and immediately stop any activity that may have triggered the attack. If your symptoms do not improve, get medical help.   Pay attention to increasing quick-relief inhaler use. If you find yourself relying on your quick-relief inhaler (such as albuterol), your asthma is not under control. See your caregiver about adjusting your treatment.  IDENTIFY AND CONTROL FACTORS THAT MAKE YOUR ASTHMA WORSE A number of common things can set off or make your asthma symptoms worse (asthma triggers). Keep track of your asthma symptoms for several weeks, detailing all the environmental and emotional factors that are linked with your asthma. When you have an asthma attack, go back to your asthma diary to see which factor, or combination of factors, might have contributed to it. Once you know what these factors are, you can take steps to control many of them.  Allergies: If you have allergies and asthma, it is important to take asthma prevention steps at home. Asthma attacks (worsening of asthma symptoms) can be triggered by allergies, which can cause temporary increased inflammation of your airways. Minimizing contact with the substance to which you are allergic will help prevent an asthma attack. Animal Dander:   Some people are allergic to the flakes of skin or dried saliva from animals with fur or feathers. Keep these pets out of your home.   If you can't keep a pet outdoors, keep  the pet out of your bedroom and other sleeping areas at all times, and keep the door closed.   Remove carpets and furniture covered with cloth from your home. If that is not possible, keep the pet away from fabric-covered furniture and carpets.  Dust Mites:  Many people with asthma are allergic to dust mites. Dust mites are tiny bugs that are found in every home, in mattresses, pillows, carpets, fabric-covered furniture, bedcovers, clothes, stuffed toys, fabric, and other fabric-covered items.   Cover your mattress in a special dust-proof cover.   Cover your pillow in a special dust-proof cover, or wash the pillow each week in hot water. Water must be hotter than 130 F to kill dust mites. Cold or warm water used with detergent and bleach can also be effective.   Wash the sheets and blankets on your bed each week in hot water.   Try not to sleep or lie on cloth-covered cushions.   Call ahead when traveling and ask for a smoke-free hotel room. Bring your own bedding and pillows, in case the hotel only supplies feather pillows and down comforters,   which may contain dust mites and cause asthma symptoms.   Remove carpets from your bedroom and those laid on concrete, if you can.   Keep stuffed toys out of the bed, or wash the toys weekly in hot water or cooler water with detergent and bleach.  Cockroaches:  Many people with asthma are allergic to the droppings and remains of cockroaches.   Keep food and garbage in closed containers. Never leave food out.   Use poison baits, traps, powders, gels, or paste (for example, boric acid).   If a spray is used to kill cockroaches, stay out of the room until the odor goes away.  Indoor Mold:  Fix leaky faucets, pipes, or other sources of water that have mold around them.   Clean moldy surfaces with a cleaner that has bleach in it.  Pollen and Outdoor Mold:  When pollen or mold spore counts are high, try to keep your windows closed.   Stay  indoors with windows closed from late morning to afternoon, if you can. Pollen and some mold spore counts are highest at that time.   Ask your caregiver whether you need to take or increase anti-inflammatory medicine before your allergy season starts.  Irritants:   Tobacco smoke is an irritant. If you smoke, ask your caregiver how you can quit. Ask family members to quit smoking, too. Do not allow smoking in your home or car.   If possible, do not use a wood-burning stove, kerosene heater, or fireplace. Minimize exposure to all sources of smoke, including incense, candles, fires, and fireworks.   Try to stay away from strong odors and sprays, such as perfume, talcum powder, hair spray, and paints.   Decrease humidity in your home and use an indoor air cleaning device. Reduce indoor humidity to below 60 percent. Dehumidifiers or central air conditioners can do this.   Try to have someone else vacuum for you once or twice a week, if you can. Stay out of rooms while they are being vacuumed and for a short while afterward.   If you vacuum, use a dust mask from a hardware store, a double-layered or microfilter vacuum cleaner bag, or a vacuum cleaner with a HEPA filter.   Sulfites in foods and beverages can be irritants. Do not drink beer or wine, or eat dried fruit, processed potatoes, or shrimp if they cause asthma symptoms.   Cold air can trigger an asthma attack. Cover your nose and mouth with a scarf on cold or windy days.   Several health conditions can make asthma more difficult to manage, including runny nose, sinus infections, reflux disease, psychological stress, and sleep apnea. Your caregiver will treat these conditions, as well.   Avoid close contact with people who have a cold or the flu, since your asthma symptoms may get worse if you catch the infection from them. Wash your hands thoroughly after touching items that may have been handled by people with a respiratory infection.    Get a flu shot every year to protect against the flu virus, which often makes asthma worse for days or weeks. Also get a pneumonia shot once every five to 10 years.  Drugs:  Aspirin and other painkillers can cause asthma attacks. 10% to 20% of people with asthma have sensitivity to aspirin or a group of painkillers called non-steroidal anti-inflammatory drugs (NSAIDS), such as ibuprofen and naproxen. These drugs are used to treat pain and reduce fevers. Asthma attacks caused by any of these medicines can   be severe and even fatal. These drugs must be avoided in people who have known aspirin sensitive asthma. Products with acetaminophen are considered safe for people who have asthma. It is important that people with aspirin sensitivity read labels of all over-the-counter drugs used to treat pain, colds, coughs, and fever.   Beta blockers and ACE inhibitors are other drugs which you should discuss with your caregiver, in relation to your asthma.  ALLERGY SKIN TESTING  Ask your asthma caregiver about allergy skin testing or blood testing (RAST test) to identify the allergens to which you are sensitive. If you are found to have allergies, allergy shots (immunotherapy) for asthma may help prevent future allergies and asthma. With allergy shots, small doses of allergens (substances to which you are allergic) are injected under your skin on a regular schedule. Over a period of time, your body may become used to the allergen and less responsive with asthma symptoms. You can also take measures to minimize your exposure to those allergens. EXERCISE  If you have exercise-induced asthma, or are planning vigorous exercise, or exercise in cold, humid, or dry environments, prevent exercise-induced asthma by following your caregiver's advice regarding asthma treatment before exercising. Document Released: 04/02/2009 Document Revised: 04/03/2011 Document Reviewed: 04/02/2009 [employer] Patient Information 2012  [employer]. 

## 2011-09-08 ENCOUNTER — Ambulatory Visit (INDEPENDENT_AMBULATORY_CARE_PROVIDER_SITE_OTHER): Payer: Managed Care, Other (non HMO) | Admitting: Family Medicine

## 2011-09-08 VITALS — BP 132/91 | HR 88 | Temp 98.3°F | Resp 16 | Ht 64.5 in | Wt 216.0 lb

## 2011-09-08 DIAGNOSIS — R21 Rash and other nonspecific skin eruption: Secondary | ICD-10-CM

## 2011-09-08 DIAGNOSIS — L299 Pruritus, unspecified: Secondary | ICD-10-CM

## 2011-09-08 LAB — POCT CBC
Granulocyte percent: 66 %G (ref 37–80)
HCT, POC: 38.5 % (ref 37.7–47.9)
Hemoglobin: 12.4 g/dL (ref 12.2–16.2)
MCHC: 32.2 g/dL (ref 31.8–35.4)
MCV: 91.9 fL (ref 80–97)
POC Granulocyte: 4.8 (ref 2–6.9)
POC LYMPH PERCENT: 27.9 %L (ref 10–50)

## 2011-09-08 NOTE — Progress Notes (Signed)
  Patient Name: [name] Date of Birth: 01-29-1966 Medical Record Number: [id number] Gender: female Date of Encounter: 09/08/2011  History of Present Illness:  [name] is a 46 y.o. very pleasant female patient who presents with the following:  Was at her job on wednesday- all of a sudden she felt itching/ biting feeling on her right arm.  Then she immediatley developed a rash on her right arm. It consisted of scattered pruritic lesions. She called her dermatologist the next day because it was itching and spreading.  She went to her derm on Friday- diagnosed with possible allergic reaction.  She was given kenalog cream and hydrozyzine. She also received a prednisone rx but has not filled it.  However, she did notice improvement and now has the rash on her left arm as well- and her 34 year old daughter has a similar rash.    Otherwise she feels well and denies any chance of pregnancy.  No fever, ST, N/V or other symptoms LMP = today   There is no problem list on file for this patient.  No past medical history on file. No past surgical history on file. History  Substance Use Topics  . Smoking status: Never Smoker   . Smokeless tobacco: Not on file  . Alcohol Use: Not on file   No family history on file. No Known Allergies  Medication list has been reviewed and updated.  Review of Systems: As per HPI- otherwise negative. No fever, no ST, no other   Physical Examination: Filed Vitals:   09/08/11 1759  BP: 132/91  Pulse: 88  Temp: 98.3 F (36.8 C)  Resp: 16  Height: 5' 4.5" (1.638 m)  Weight: 216 lb (97.977 kg)    Body mass index is 36.50 kg/(m^2).  GEN: WDWN, NAD, Non-toxic, A & O x 3, obese HEENT: Atraumatic, Normocephalic. Neck supple. No masses, No LAD.  TM wnl, no oral lesions Ears and Nose: No external deformity. CV: RRR, No M/G/R. No JVD. No thrill. No extra heart sounds. PULM: CTA B, no wheezes, crackles, rhonchi. No retractions. No resp.  distress. No accessory muscle use. EXTR: No c/c/e NEURO Normal gait.  PSYCH: Normally interactive. Conversant. Not depressed or anxious appearing.  Calm demeanor.  Skin: there are discrete, vesicular/ "pox- like" lesions along both arms, more on the right.  No other lesions on her body.  Hands spared.     Assessment and Plan: 1. Rash  Parvovirus B19 antibody, IgG and IgM, POCT CBC  2. Itching      [name] and her daughter likely have a viral infection- parvovirus is a possibility.  Await titer as above.  Keep away from any pregnant persons.  [name] is frustrated by her itching but does not want to take prednisone as it could suppress her immune response.  She will let us know if she is getting worse while we await her lab results.

## 2011-09-08 NOTE — Patient Instructions (Signed)
Parvovirus B19 Antibody This is a blood test which includes several different classes of viruses. The B19 virus causes disease in humans. It is a common cause of erythema infectiosum which occurs mostly in children and is otherwise known as fifth disease.  PREPARATION FOR TEST No preparation or fasting is necessary. NORMAL FINDINGS Negative for IgM- and IgG-specific antibodies to parvovirus B19. Ranges for normal findings may vary among different laboratories and hospitals. You should always check with your doctor after having lab work or other tests done to discuss the meaning of your test results and whether your values are considered within normal limits. MEANING OF TEST  Your caregiver will go over the test results with you and discuss the importance and meaning of your results, as well as treatment options and the need for additional tests if necessary. OBTAINING THE TEST RESULTS  It is your responsibility to obtain your test results. Ask the lab or department performing the test when and how you will get your results. Document Released: 05/17/2004 Document Revised: 04/03/2011 Document Reviewed: 03/26/2008 [facility] Patient Information 2012 [place], [place].

## 2011-09-10 ENCOUNTER — Telehealth: Payer: Self-pay

## 2011-09-10 NOTE — Telephone Encounter (Signed)
Pt called wanting to know lab results. Please call pt  Back at [phone]

## 2011-09-10 NOTE — Telephone Encounter (Signed)
Dr. Patsy Lager can your please review pt's labs. Thanks

## 2011-09-11 ENCOUNTER — Other Ambulatory Visit: Payer: Self-pay | Admitting: Family Medicine

## 2011-09-11 DIAGNOSIS — L089 Local infection of the skin and subcutaneous tissue, unspecified: Secondary | ICD-10-CM

## 2011-09-11 LAB — PARVOVIRUS B19 ANTIBODY, IGG AND IGM: Parovirus B19 IgG Abs: 1.3 index — ABNORMAL HIGH (ref <0.9)

## 2011-09-11 MED ORDER — CEPHALEXIN 500 MG PO CAPS: 500.0000 mg | ORAL_CAPSULE | Freq: Two times a day (BID) | ORAL | Status: AC

## 2011-09-11 NOTE — Progress Notes (Signed)
Called and let her know that antibody testing suggests this is NOT acute parvo.  She would like to have an antibiotic in case this is bacterial- is a reasonable idea to try.  Also will take the prednisone she was given by her derm.  Otherwise she feels the same- itchy rash but otherwise well.  She will call with update in a couple of days

## 2012-01-30 ENCOUNTER — Ambulatory Visit (INDEPENDENT_AMBULATORY_CARE_PROVIDER_SITE_OTHER): Payer: Managed Care, Other (non HMO) | Admitting: Physician Assistant

## 2012-01-30 VITALS — BP 128/79 | HR 80 | Temp 98.4°F | Resp 18 | Ht 64.0 in | Wt 219.0 lb

## 2012-01-30 DIAGNOSIS — J45909 Unspecified asthma, uncomplicated: Secondary | ICD-10-CM | POA: Insufficient documentation

## 2012-01-30 DIAGNOSIS — J40 Bronchitis, not specified as acute or chronic: Secondary | ICD-10-CM

## 2012-01-30 LAB — POCT CBC
HCT, POC: 42.4 % (ref 37.7–47.9)
Lymph, poc: 2.2 (ref 0.6–3.4)
MCHC: 32.3 g/dL (ref 31.8–35.4)
MID (cbc): 0.7 (ref 0–0.9)
POC Granulocyte: 8.7 — AB (ref 2–6.9)
POC LYMPH PERCENT: 19.1 %L (ref 10–50)
Platelet Count, POC: 253 10*3/uL (ref 142–424)
RDW, POC: 13.2 %

## 2012-01-30 MED ORDER — AZITHROMYCIN 500 MG PO TABS: 500.0000 mg | ORAL_TABLET | Freq: Every day | ORAL | Status: DC

## 2012-01-30 MED ORDER — HYDROCODONE-HOMATROPINE 5-1.5 MG/5ML PO SYRP: 5.0000 mL | ORAL_SOLUTION | Freq: Three times a day (TID) | ORAL | Status: DC | PRN

## 2012-01-30 MED ORDER — AZITHROMYCIN 250 MG PO TABS: ORAL_TABLET | ORAL | Status: DC

## 2012-01-30 NOTE — Progress Notes (Signed)
[address], [place] [address]   Phone [phone]  Subjective:    Patient ID: [name], female    DOB: 1965/07/22, 46 y.o.   MRN: [id number]  HPI Pt presents to clinic with 4 d h/o cold symptoms.  Her children were sick last week.  She has cough with green sputum.  She has no nasal congestion.  She seems to get this every year with the weather change.  Last problem was in the spring.  She has pretty significant asthma that is well controlled until she gets sick.  She feels like her asthma is doing pretty well now compared with her condition in the spring.  She is only having to use her albuterol neb and combivent in the evening and gets relief from her cough and chest burning.  She only has SOB and wheezing with uphill walking but she has not stopped her walking for exercise. The burning in her chest is her worse problem.   Review of Systems  Constitutional: Negative for fever and chills.  Respiratory: Positive for cough (green sputum), shortness of breath (only with activity) and wheezing (only with activity).        Objective:   Physical Exam  Vitals reviewed. Constitutional: She is oriented to person, place, and time. She appears well-developed and well-nourished.  HENT:  Head: Normocephalic and atraumatic.  Right Ear: Hearing, tympanic membrane, external ear and ear canal normal.  Left Ear: Hearing, tympanic membrane, external ear and ear canal normal.  Nose: Mucosal edema (pale) present.  Mouth/Throat: Uvula is midline.  Eyes: Conjunctivae normal are normal.  Neck: Neck supple.  Cardiovascular: Normal rate, regular rhythm and normal heart sounds.   No murmur heard. Pulmonary/Chest: Effort normal. No respiratory distress. She has no decreased breath sounds. She has wheezes (end expiratory and bases>apex of lungs). She has no rales.  Lymphadenopathy:    She has no cervical adenopathy.  Neurological: She is alert and oriented to person, place, and time.  Skin:  Skin is warm and dry.  Psychiatric: She has a normal mood and affect. Her behavior is normal. Judgment and thought content normal.   Results for orders placed in visit on 01/30/12  POCT CBC      Component Value Range   WBC 11.6 (*) 4.6 - 10.2 K/uL   Lymph, poc 2.2  0.6 - 3.4   POC LYMPH PERCENT 19.1  10 - 50 %L   MID (cbc) 0.7  0 - 0.9   POC MID % 5.8  0 - 12 %M   POC Granulocyte 8.7 (*) 2 - 6.9   Granulocyte percent 75.1  37 - 80 %G   RBC 4.46  4.04 - 5.48 M/uL   Hemoglobin 13.7  12.2 - 16.2 g/dL   HCT, POC 21.3  08.6 - 47.9 %   MCV 95.1  80 - 97 fL   MCH, POC 30.7  27 - 31.2 pg   MCHC 32.3  31.8 - 35.4 g/dL   RDW, POC 57.8     Platelet Count, POC 253  142 - 424 K/uL   MPV 8.9  0 - 99.8 fL          Assessment & Plan:   1. Asthma  POCT CBC  2. Bronchitis  POCT CBC, HYDROcodone-homatropine (HYCODAN) 5-1.5 MG/5ML syrup, azithromycin (ZITHROMAX) 500 MG tablet, DISCONTINUED: azithromycin (ZITHROMAX Z-PAK) 250 MG tablet   D/w pt at length her asthma and her history.  I feel at this time with normal pulse  and good air movement that we can try treatment without prednisone (pt agrees with past history and results).  Will treat with abx and cough meds.  She should increase her combivent to qid and continue to use her albuterol inhaler prn.  She should continue all her other asthma medications.  Due to her significant asthma pt should monitor her symptoms and her response to her inhalers and if worse tomorrow RTC for recheck for possible CXR and prednisone or if she has not started to improve in 48h.  She should continue her mucinex.  Pt agrees with the above.

## 2012-09-23 ENCOUNTER — Ambulatory Visit: Payer: Managed Care, Other (non HMO)

## 2013-01-24 ENCOUNTER — Ambulatory Visit (INDEPENDENT_AMBULATORY_CARE_PROVIDER_SITE_OTHER): Payer: Managed Care, Other (non HMO) | Admitting: Physician Assistant

## 2013-01-24 VITALS — BP 118/78 | HR 82 | Temp 98.3°F | Resp 18 | Ht 64.5 in | Wt 213.0 lb

## 2013-01-24 DIAGNOSIS — R059 Cough, unspecified: Secondary | ICD-10-CM

## 2013-01-24 DIAGNOSIS — R0981 Nasal congestion: Secondary | ICD-10-CM

## 2013-01-24 DIAGNOSIS — R062 Wheezing: Secondary | ICD-10-CM

## 2013-01-24 DIAGNOSIS — R05 Cough: Secondary | ICD-10-CM

## 2013-01-24 DIAGNOSIS — J329 Chronic sinusitis, unspecified: Secondary | ICD-10-CM

## 2013-01-24 DIAGNOSIS — J3489 Other specified disorders of nose and nasal sinuses: Secondary | ICD-10-CM

## 2013-01-24 MED ORDER — HYDROCODONE-HOMATROPINE 5-1.5 MG/5ML PO SYRP: 5.0000 mL | ORAL_SOLUTION | Freq: Three times a day (TID) | ORAL | Status: DC | PRN

## 2013-01-24 MED ORDER — IPRATROPIUM BROMIDE 0.03 % NA SOLN: 2.0000 | Freq: Two times a day (BID) | NASAL | Status: DC

## 2013-01-24 MED ORDER — AMOXICILLIN-POT CLAVULANATE 875-125 MG PO TABS: 1.0000 | ORAL_TABLET | Freq: Two times a day (BID) | ORAL | Status: DC

## 2013-01-24 MED ORDER — METHYLPREDNISOLONE (PAK) 4 MG PO TABS: ORAL_TABLET | ORAL | Status: DC

## 2013-01-24 NOTE — Progress Notes (Signed)
  Subjective:    Patient ID: [name], female    DOB: Jul 26, 1965, 47 y.o.   MRN: [id number]  HPI 47 year old female presents with 5 day history of nasal congestion, PND, cough, sinus pressure, and rhinorrhea. Admits to chills and subjective fever.  Cough is intermittently productive of yellow sputum.  No hemoptysis, SOB, nausea, vomiting, headache, sore throat, or abdominal pain.  Does have hx of asthma that is controlled on daily treatment. Does have hx of seasonal allergies that causes asthma flare.  Has been using her albuterol inhaler more the last week due to this illness.  Also has an albuterol nebulizer at home that she has been using in the evening.  She has been taking Mucinex which has helped some with her symptoms.   Patient is otherwise doing well with no other concerns today. No hx of DM.      Review of Systems  Constitutional: Positive for fever (subjective) and chills.  HENT: Positive for congestion, rhinorrhea, postnasal drip and sinus pressure. Negative for sore throat.   Respiratory: Positive for cough, chest tightness and wheezing. Negative for shortness of breath.   Gastrointestinal: Negative for nausea, vomiting and abdominal pain.  Neurological: Negative for dizziness and headaches.       Objective:   Physical Exam  Constitutional: She is oriented to person, place, and time. She appears well-developed and well-nourished.  HENT:  Head: Normocephalic and atraumatic.  Right Ear: Hearing, tympanic membrane, external ear and ear canal normal.  Left Ear: Hearing, tympanic membrane, external ear and ear canal normal.  Nose: Right sinus exhibits maxillary sinus tenderness. Left sinus exhibits maxillary sinus tenderness.  Mouth/Throat: Uvula is midline, oropharynx is clear and moist and mucous membranes are normal. No oropharyngeal exudate (clear postnasal drainage).  Eyes: Conjunctivae are normal.  Neck: Normal range of motion. Neck supple.  Cardiovascular: Normal  rate, regular rhythm and normal heart sounds.   Pulmonary/Chest: Effort normal. She has wheezes.  Lymphadenopathy:    She has no cervical adenopathy.  Neurological: She is alert and oriented to person, place, and time.  Psychiatric: She has a normal mood and affect. Her behavior is normal. Judgment and thought content normal.          Assessment & Plan:  Sinusitis - Plan: amoxicillin-clavulanate (AUGMENTIN) 875-125 MG per tablet  Cough - Plan: HYDROcodone-homatropine (HYCODAN) 5-1.5 MG/5ML syrup  Nasal congestion - Plan: ipratropium (ATROVENT) 0.03 % nasal spray  Wheezing - Plan: methylPREDNIsolone (MEDROL DOSPACK) 4 MG tablet  Due to sinus pain will treat with Augmentin 875 mg bid x 10 days.   Hycodan q8hours prn cough - caution sedation Recommend she go home and use nebulizer - patient declined neb today due to time Continue daily maintenance inhalers. Albuterol inhaler as needed Atrovent NS twice daily to help with congestion and PND Medrol dose pack as directed for wheezing and sinusitis Follow up if symptoms worsen or fail to improve.

## 2013-09-01 ENCOUNTER — Ambulatory Visit (INDEPENDENT_AMBULATORY_CARE_PROVIDER_SITE_OTHER): Payer: Managed Care, Other (non HMO) | Admitting: Family Medicine

## 2013-09-01 VITALS — BP 120/78 | HR 89 | Temp 98.3°F | Resp 16 | Ht 64.0 in | Wt 215.0 lb

## 2013-09-01 DIAGNOSIS — R059 Cough, unspecified: Secondary | ICD-10-CM

## 2013-09-01 DIAGNOSIS — R05 Cough: Secondary | ICD-10-CM

## 2013-09-01 DIAGNOSIS — J309 Allergic rhinitis, unspecified: Secondary | ICD-10-CM

## 2013-09-01 DIAGNOSIS — J45909 Unspecified asthma, uncomplicated: Secondary | ICD-10-CM

## 2013-09-01 MED ORDER — AZITHROMYCIN 250 MG PO TABS: ORAL_TABLET | ORAL | Status: DC

## 2013-09-01 MED ORDER — PREDNISONE 20 MG PO TABS: 40.0000 mg | ORAL_TABLET | Freq: Every day | ORAL | Status: DC

## 2013-09-01 MED ORDER — HYDROCODONE-HOMATROPINE 5-1.5 MG/5ML PO SYRP: ORAL_SOLUTION | ORAL | Status: DC

## 2013-09-01 NOTE — Progress Notes (Addendum)
Subjective:  This chart was scribed for [employer]. Carlota Raspberry, MD, by Stacy Gardner, [facility] Scribe. The patient was seen in room and the patient's care was started at 12:17 PM.  Authored by Janeann Forehand, MD - unable to change in [facility].    Patient ID: [name], female    DOB: 1966/04/11, 48 y.o.   MRN: [id number] Chief Complaint  Patient presents with   Allergies   Asthma    Asthma She complains of cough (productive cough with yellow mucus ) and wheezing. Associated symptoms include postnasal drip, rhinorrhea and sneezing. Her past medical history is significant for asthma.   HPI Comments: [name] is a 48 y.o. female who arrives to the [facility] for allergies and asthma, onset last week. Pt was seen September 2014 at [facility] for allergy symptoms. Pt was given Medrol dose pack at the September office visit. She has the associated symptoms of sneezing, wheezing, and sinus drainage. Pt's wheezing presented a few days ago and is worse at night. She uses an Albuterol nebulizer at night to relieve her gradually worsening productive cough (with yellow sputum) and wheezing. Pt has a cough which is temporarily relieved by the use of her Albuterol inhaler. Pt uses Dulera and Qvar throughout the year. Pt ran out of Qvar two week ago.Pt is taking Albuterol in the middle of the day, everyday since last week. She explain this is fairly normal usage. Pt did not use her rescue inhaler yet today. She has taken prednisone and flonase in past, but dislikes it. Pt had positive improvements of her symptoms with taking Hycodan syrup last year. She has an appointment with Dr. Allena Katz next week. Her allergies are at its worse this time of year and during the fall.     Patient Active Problem List   Diagnosis Date Noted   Asthma 01/30/2012   Past Medical History  Diagnosis Date   Asthma    No past surgical history on file. No Known  Allergies Prior to Admission medications   Medication Sig Start Date End Date Taking? Authorizing Provider  albuterol (PROVENTIL) (2.5 MG/3ML) 0.083% nebulizer solution Take 2.5 mg by nebulization every 6 (six) hours as needed.   Yes Historical Provider, MD  albuterol-ipratropium (COMBIVENT) 18-103 MCG/ACT inhaler Inhale 2 puffs into the lungs every 6 (six) hours as needed.   Yes Historical Provider, MD  beclomethasone (QVAR) 40 MCG/ACT inhaler Inhale 2 puffs into the lungs 2 (two) times daily.   Yes Historical Provider, MD  fexofenadine (ALLEGRA) 180 MG tablet Take 180 mg by mouth daily.   Yes Historical Provider, MD  mometasone-formoterol (DULERA) 100-5 MCG/ACT AERO Inhale 2 puffs into the lungs 2 (two) times daily.   Yes Historical Provider, MD   History   Social History   Marital Status: Married    Spouse Name: N/A    Number of Children: N/A   Years of Education: N/A   Occupational History   Not on file.   Social History Main Topics   Smoking status: Never Smoker    Smokeless tobacco: Not on file   Alcohol Use: Not on file   Drug Use: Not on file   Sexual Activity: Not on file   Other Topics Concern   Not on file   Social History Narrative   No narrative on file      Patient Active Problem List   Diagnosis Date Noted   Asthma 01/30/2012  Past Medical History  Diagnosis Date   Asthma    No past surgical history on file. No Known Allergies Prior to Admission medications   Medication Sig Start Date End Date Taking? Authorizing Provider  albuterol (PROVENTIL) (2.5 MG/3ML) 0.083% nebulizer solution Take 2.5 mg by nebulization every 6 (six) hours as needed.   Yes Historical Provider, MD  albuterol-ipratropium (COMBIVENT) 18-103 MCG/ACT inhaler Inhale 2 puffs into the lungs every 6 (six) hours as needed.   Yes Historical Provider, MD  beclomethasone (QVAR) 40 MCG/ACT inhaler Inhale 2 puffs into the lungs 2 (two) times daily.   Yes Historical Provider, MD   fexofenadine (ALLEGRA) 180 MG tablet Take 180 mg by mouth daily.   Yes Historical Provider, MD  mometasone-formoterol (DULERA) 100-5 MCG/ACT AERO Inhale 2 puffs into the lungs 2 (two) times daily.   Yes Historical Provider, MD   History   Social History   Marital Status: Married    Spouse Name: N/A    Number of Children: N/A   Years of Education: N/A   Occupational History   Not on file.   Social History Main Topics   Smoking status: Never Smoker    Smokeless tobacco: Not on file   Alcohol Use: Not on file   Drug Use: Not on file   Sexual Activity: Not on file   Other Topics Concern   Not on file   Social History Narrative   No narrative on file     Review of Systems  HENT: Positive for postnasal drip, rhinorrhea and sneezing.   Respiratory: Positive for cough (productive cough with yellow mucus ) and wheezing.        Objective:   Physical Exam  Nursing note and vitals reviewed. Constitutional: She is oriented to person, place, and time. She appears well-developed and well-nourished. No distress.  HENT:  Head: Normocephalic and atraumatic.  Nose: Mucosal edema present.  Eyes: EOM are normal.  Neck: Neck supple. No tracheal deviation present.  Cardiovascular: Normal rate and normal heart sounds.  Exam reveals no gallop and no friction rub.   No murmur heard. Pulmonary/Chest: Effort normal. No respiratory distress. She has wheezes.  Expiratory wheezing No retractions   Musculoskeletal: Normal range of motion.  Neurological: She is alert and oriented to person, place, and time.  Skin: Skin is warm and dry.  Psychiatric: She has a normal mood and affect. Her behavior is normal.   Filed Vitals:   09/01/13 1109  BP: 120/78  Pulse: 89  Temp: 98.3 F (36.8 C)  TempSrc: Oral  Resp: 16  Height: 5\' 4"  (1.626 m)  Weight: 215 lb (97.523 kg)  SpO2: 98%       Assessment & Plan:   [name] is a 48 y.o. female Cough - Plan:  HYDROcodone-homatropine (HYCODAN) 5-1.5 MG/5ML syrup  Allergic rhinitis  Asthmatic bronchitis - Plan: azithromycin (ZITHROMAX) 250 MG tablet, predniSONE (DELTASONE) 20 MG tablet  Allergies and asthma flair, asthmatic bonchitis with change of sputum color - discussed meds, and not sure she is supposed to be on 2 inhaled steroids with Qvar and Dulera.  Will clarify with allergist, but his office is currently closed for lunch. Continue dulera, albuterol up to every 4-6 hrs, add flonase, and if wheezing/cough not improving into tomorrow or still requiring albuterol frequently - start prednisone.  SED and rtc precautions discussed.   Agreed on Hycodan only if irritative cough or PND cough, NOT to be taken if wheezing, short of breath or tightness sx's,  and discussed resp suppression with this medicine and concerns with this - understanding expressed    Meds ordered this encounter  Medications   azithromycin (ZITHROMAX) 250 MG tablet    Sig: Take 2 pills by mouth on day 1, then 1 pill by mouth per day on days 2 through 5.    Dispense:  6 each    Refill:  0   predniSONE (DELTASONE) 20 MG tablet    Sig: Take 2 tablets (40 mg total) by mouth daily with breakfast.    Dispense:  10 tablet    Refill:  0   HYDROcodone-homatropine (HYCODAN) 5-1.5 MG/5ML syrup    Sig: 49m by mouth a bedtime as needed for cough.    Dispense:  120 mL    Refill:  0   Patient Instructions  Albuterol by inhaler or nebulizer ever 4-6 hours as needed. Continue the [facility], start Zpak.  If still wheezing and frequent use of albuterol for wheezing tomorrow - start prednisone. Only use hycodan cough syrup at night ONLY IF not wheezing or short of breath (treat the cough initially with inhaler or nebulizer as discussed).  Continue fexofenadine but can start flonase for better allergy relief. Return to the clinic or go to the nearest emergency room if any of your symptoms worsen or new symptoms occur.  Allergic Rhinitis Allergic  rhinitis is when the mucous membranes in the nose respond to allergens. Allergens are particles in the air that cause your body to have an allergic reaction. This causes you to release allergic antibodies. Through a chain of events, these eventually cause you to release histamine into the blood stream. Although meant to protect the body, it is this release of histamine that causes your discomfort, such as frequent sneezing, congestion, and an itchy, runny nose.  CAUSES  Seasonal allergic rhinitis (hay fever) is caused by pollen allergens that may come from grasses, trees, and weeds. Year-round allergic rhinitis (perennial allergic rhinitis) is caused by allergens such as house dust mites, pet dander, and mold spores.  SYMPTOMS   Nasal stuffiness (congestion).  Itchy, runny nose with sneezing and tearing of the eyes. DIAGNOSIS  Your health care provider can help you determine the allergen or allergens that trigger your symptoms. If you and your health care provider are unable to determine the allergen, skin or blood testing may be used. TREATMENT  Allergic Rhinitis does not have a cure, but it can be controlled by:  Medicines and allergy shots (immunotherapy).  Avoiding the allergen. Hay fever may often be treated with antihistamines in pill or nasal spray forms. Antihistamines block the effects of histamine. There are over-the-counter medicines that may help with nasal congestion and swelling around the eyes. Check with your health care provider before taking or giving this medicine.  If avoiding the allergen or the medicine prescribed do not work, there are many new medicines your health care provider can prescribe. Stronger medicine may be used if initial measures are ineffective. Desensitizing injections can be used if medicine and avoidance does not work. Desensitization is when a patient is given ongoing shots until the body becomes less sensitive to the allergen. Make sure you follow up with  your health care provider if problems continue. HOME CARE INSTRUCTIONS It is not possible to completely avoid allergens, but you can reduce your symptoms by taking steps to limit your exposure to them. It helps to know exactly what you are allergic to so that you can avoid your specific triggers. [place]  CARE IF:   You have a fever.  You develop a cough that does not stop easily (persistent).  You have shortness of breath.  You start wheezing.  Symptoms interfere with normal daily activities. Document Released: 01/07/2001 Document Revised: 02/02/2013 Document Reviewed: 12/20/2012 [facility] Patient Information 2014 [place]. Asthma, Adult Asthma is a recurring condition in which the airways tighten and narrow. Asthma can make it difficult to breathe. It can cause coughing, wheezing, and shortness of breath. Asthma episodes (also called asthma attacks) range from minor to life-threatening. Asthma cannot be cured, but medicines and lifestyle changes can help control it. CAUSES Asthma is believed to be caused by inherited (genetic) and environmental factors, but its exact cause is unknown. Asthma may be triggered by allergens, lung infections, or irritants in the air. Asthma triggers are different for each person. Common triggers include:   Animal dander.  Dust mites.  Cockroaches.  Pollen from trees or grass.  Mold.  Smoke.  Air pollutants such as dust, household cleaners, hair sprays, aerosol sprays, paint fumes, strong chemicals, or strong odors.  Cold air, weather changes, and winds (which increase molds and pollens in the air).  Strong emotional expressions such as crying or laughing hard.  Stress.  Certain medicines (such as aspirin) or types of drugs (such as beta-blockers).  Sulfites in foods and drinks. Foods and drinks that may contain sulfites include dried fruit, potato chips, and sparkling grape juice.  Infections or inflammatory conditions such as the  flu, a cold, or an inflammation of the nasal membranes (rhinitis).  Gastroesophageal reflux disease (GERD).  Exercise or strenuous activity. SYMPTOMS Symptoms may occur immediately after asthma is triggered or many hours later. Symptoms include:  Wheezing.  Excessive nighttime or early morning coughing.  Frequent or severe coughing with a common cold.  Chest tightness.  Shortness of breath. DIAGNOSIS  The diagnosis of asthma is made by a review of your medical history and a physical exam. Tests may also be performed. These may include:  Lung function studies. These tests show how much air you breath in and out.  Allergy tests.  Imaging tests such as X-rays. TREATMENT  Asthma cannot be cured, but it can usually be controlled. Treatment involves identifying and avoiding your asthma triggers. It also involves medicines. There are 2 classes of medicine used for asthma treatment:   Controller medicines. These prevent asthma symptoms from occurring. They are usually taken every day.  Reliever or rescue medicines. These quickly relieve asthma symptoms. They are used as needed and provide short-term relief. Your health care provider will help you create an asthma action plan. An asthma action plan is a written plan for managing and treating your asthma attacks. It includes a list of your asthma triggers and how they may be avoided. It also includes information on when medicines should be taken and when their dosage should be changed. An action plan may also involve the use of a device called a peak flow meter. A peak flow meter measures how well the lungs are working. It helps you monitor your condition. HOME CARE INSTRUCTIONS   Take medicine as directed by your health care provider. Speak with your health care provider if you have questions about how or when to take the medicines.  Use a peak flow meter as directed by your health care provider. Record and keep track of  readings.  Understand and use the action plan to help minimize or stop an asthma attack without needing to seek medical  care.  Control your home environment in the following ways to help prevent asthma attacks:  Do not smoke. Avoid being exposed to secondhand smoke.  Change your heating and air conditioning filter regularly.  Limit your use of fireplaces and wood stoves.  Get rid of pests (such as roaches and mice) and their droppings.  Throw away plants if you see mold on them.  Clean your floors and dust regularly. Use unscented cleaning products.  Try to have someone else vacuum for you regularly. Stay out of rooms while they are being vacuumed and for a short while afterward. If you vacuum, use a dust mask from a hardware store, a double-layered or microfilter vacuum cleaner bag, or a vacuum cleaner with a HEPA filter.  Replace carpet with wood, tile, or vinyl flooring. Carpet can trap dander and dust.  Use allergy-proof pillows, mattress covers, and box spring covers.  Wash bed sheets and blankets every week in hot water and dry them in a dryer.  Use blankets that are made of polyester or cotton.  Clean bathrooms and kitchens with bleach. If possible, have someone repaint the walls in these rooms with mold-resistant paint. Keep out of the rooms that are being cleaned and painted.  Wash hands frequently. SEEK MEDICAL CARE IF:   You have wheezing, shortness of breath, or a cough even if taking medicine to prevent attacks.  The colored mucus you cough up (sputum) is thicker than usual.  Your sputum changes from clear or white to yellow, green, gray, or bloody.  You have any problems that may be related to the medicines you are taking (such as a rash, itching, swelling, or trouble breathing).  You are using a reliever medicine more than 2 3 times per week.  Your peak flow is still at 50 79% of you personal best after following your action plan for 1 hour. SEEK IMMEDIATE  MEDICAL CARE IF:   You seem to be getting worse and are unresponsive to treatment during an asthma attack.  You are short of breath even at rest.  You get short of breath when doing very little physical activity.  You have difficulty eating, drinking, or talking due to asthma symptoms.  You develop chest pain.  You develop a fast heartbeat.  You have a bluish color to your lips or fingernails.  You are lightheaded, dizzy, or faint.  Your peak flow is less than 50% of your personal best.  You have a fever or persistent symptoms for more than 2 3 days.  You have a fever and symptoms suddenly get worse. MAKE SURE YOU:   Understand these instructions.  Will watch your condition.  Will get help right away if you are not doing well or get worse. Document Released: 04/14/2005 Document Revised: 12/15/2012 Document Reviewed: 11/11/2012 [facility] Patient Information 2014 [place], [place].    I personally performed the services described in this documentation, which was scribed in my presence. The recorded information has been reviewed and considered, and addended by me as needed.

## 2013-09-01 NOTE — Patient Instructions (Signed)
Albuterol by inhaler or nebulizer ever 4-6 hours as needed. Continue the [facility], start Zpak.  If still wheezing and frequent use of albuterol for wheezing tomorrow - start prednisone. Only use hycodan cough syrup at night ONLY IF not wheezing or short of breath (treat the cough initially with inhaler or nebulizer as discussed).  Continue fexofenadine but can start flonase for better allergy relief. Return to the clinic or go to the nearest emergency room if any of your symptoms worsen or new symptoms occur.  Allergic Rhinitis Allergic rhinitis is when the mucous membranes in the nose respond to allergens. Allergens are particles in the air that cause your body to have an allergic reaction. This causes you to release allergic antibodies. Through a chain of events, these eventually cause you to release histamine into the blood stream. Although meant to protect the body, it is this release of histamine that causes your discomfort, such as frequent sneezing, congestion, and an itchy, runny nose.  CAUSES  Seasonal allergic rhinitis (hay fever) is caused by pollen allergens that may come from grasses, trees, and weeds. Year-round allergic rhinitis (perennial allergic rhinitis) is caused by allergens such as house dust mites, pet dander, and mold spores.  SYMPTOMS   Nasal stuffiness (congestion).  Itchy, runny nose with sneezing and tearing of the eyes. DIAGNOSIS  Your health care provider can help you determine the allergen or allergens that trigger your symptoms. If you and your health care provider are unable to determine the allergen, skin or blood testing may be used. TREATMENT  Allergic Rhinitis does not have a cure, but it can be controlled by:  Medicines and allergy shots (immunotherapy).  Avoiding the allergen. Hay fever may often be treated with antihistamines in pill or nasal spray forms. Antihistamines block the effects of histamine. There are over-the-counter medicines that may help with  nasal congestion and swelling around the eyes. Check with your health care provider before taking or giving this medicine.  If avoiding the allergen or the medicine prescribed do not work, there are many new medicines your health care provider can prescribe. Stronger medicine may be used if initial measures are ineffective. Desensitizing injections can be used if medicine and avoidance does not work. Desensitization is when a patient is given ongoing shots until the body becomes less sensitive to the allergen. Make sure you follow up with your health care provider if problems continue. HOME CARE INSTRUCTIONS It is not possible to completely avoid allergens, but you can reduce your symptoms by taking steps to limit your exposure to them. It helps to know exactly what you are allergic to so that you can avoid your specific triggers. SEEK MEDICAL CARE IF:   You have a fever.  You develop a cough that does not stop easily (persistent).  You have shortness of breath.  You start wheezing.  Symptoms interfere with normal daily activities. Document Released: 01/07/2001 Document Revised: 02/02/2013 Document Reviewed: 12/20/2012 [facility] Patient Information 2014 [place]. Asthma, Adult Asthma is a recurring condition in which the airways tighten and narrow. Asthma can make it difficult to breathe. It can cause coughing, wheezing, and shortness of breath. Asthma episodes (also called asthma attacks) range from minor to life-threatening. Asthma cannot be cured, but medicines and lifestyle changes can help control it. CAUSES Asthma is believed to be caused by inherited (genetic) and environmental factors, but its exact cause is unknown. Asthma may be triggered by allergens, lung infections, or irritants in the air. Asthma triggers are different  for each person. Common triggers include:   Animal dander.  Dust mites.  Cockroaches.  Pollen from trees or grass.  Mold.  Smoke.  Air pollutants  such as dust, household cleaners, hair sprays, aerosol sprays, paint fumes, strong chemicals, or strong odors.  Cold air, weather changes, and winds (which increase molds and pollens in the air).  Strong emotional expressions such as crying or laughing hard.  Stress.  Certain medicines (such as aspirin) or types of drugs (such as beta-blockers).  Sulfites in foods and drinks. Foods and drinks that may contain sulfites include dried fruit, potato chips, and sparkling grape juice.  Infections or inflammatory conditions such as the flu, a cold, or an inflammation of the nasal membranes (rhinitis).  Gastroesophageal reflux disease (GERD).  Exercise or strenuous activity. SYMPTOMS Symptoms may occur immediately after asthma is triggered or many hours later. Symptoms include:  Wheezing.  Excessive nighttime or early morning coughing.  Frequent or severe coughing with a common cold.  Chest tightness.  Shortness of breath. DIAGNOSIS  The diagnosis of asthma is made by a review of your medical history and a physical exam. Tests may also be performed. These may include:  Lung function studies. These tests show how much air you breath in and out.  Allergy tests.  Imaging tests such as X-rays. TREATMENT  Asthma cannot be cured, but it can usually be controlled. Treatment involves identifying and avoiding your asthma triggers. It also involves medicines. There are 2 classes of medicine used for asthma treatment:   Controller medicines. These prevent asthma symptoms from occurring. They are usually taken every day.  Reliever or rescue medicines. These quickly relieve asthma symptoms. They are used as needed and provide short-term relief. Your health care provider will help you create an asthma action plan. An asthma action plan is a written plan for managing and treating your asthma attacks. It includes a list of your asthma triggers and how they may be avoided. It also includes  information on when medicines should be taken and when their dosage should be changed. An action plan may also involve the use of a device called a peak flow meter. A peak flow meter measures how well the lungs are working. It helps you monitor your condition. HOME CARE INSTRUCTIONS   Take medicine as directed by your health care provider. Speak with your health care provider if you have questions about how or when to take the medicines.  Use a peak flow meter as directed by your health care provider. Record and keep track of readings.  Understand and use the action plan to help minimize or stop an asthma attack without needing to seek medical care.  Control your home environment in the following ways to help prevent asthma attacks:  Do not smoke. Avoid being exposed to secondhand smoke.  Change your heating and air conditioning filter regularly.  Limit your use of fireplaces and wood stoves.  Get rid of pests (such as roaches and mice) and their droppings.  Throw away plants if you see mold on them.  Clean your floors and dust regularly. Use unscented cleaning products.  Try to have someone else vacuum for you regularly. Stay out of rooms while they are being vacuumed and for a short while afterward. If you vacuum, use a dust mask from a hardware store, a double-layered or microfilter vacuum cleaner bag, or a vacuum cleaner with a HEPA filter.  Replace carpet with wood, tile, or vinyl flooring. Carpet can trap dander  and dust.  Use allergy-proof pillows, mattress covers, and box spring covers.  Wash bed sheets and blankets every week in hot water and dry them in a dryer.  Use blankets that are made of polyester or cotton.  Clean bathrooms and kitchens with bleach. If possible, have someone repaint the walls in these rooms with mold-resistant paint. Keep out of the rooms that are being cleaned and painted.  Wash hands frequently. SEEK MEDICAL CARE IF:   You have wheezing,  shortness of breath, or a cough even if taking medicine to prevent attacks.  The colored mucus you cough up (sputum) is thicker than usual.  Your sputum changes from clear or white to yellow, green, gray, or bloody.  You have any problems that may be related to the medicines you are taking (such as a rash, itching, swelling, or trouble breathing).  You are using a reliever medicine more than 2 3 times per week.  Your peak flow is still at 50 79% of you personal best after following your action plan for 1 hour. SEEK IMMEDIATE MEDICAL CARE IF:   You seem to be getting worse and are unresponsive to treatment during an asthma attack.  You are short of breath even at rest.  You get short of breath when doing very little physical activity.  You have difficulty eating, drinking, or talking due to asthma symptoms.  You develop chest pain.  You develop a fast heartbeat.  You have a bluish color to your lips or fingernails.  You are lightheaded, dizzy, or faint.  Your peak flow is less than 50% of your personal best.  You have a fever or persistent symptoms for more than 2 3 days.  You have a fever and symptoms suddenly get worse. MAKE SURE YOU:   Understand these instructions.  Will watch your condition.  Will get help right away if you are not doing well or get worse. Document Released: 04/14/2005 Document Revised: 12/15/2012 Document Reviewed: 11/11/2012 [facility] Patient Information 2014 [place], [place].

## 2014-07-08 ENCOUNTER — Ambulatory Visit (INDEPENDENT_AMBULATORY_CARE_PROVIDER_SITE_OTHER): Payer: Managed Care, Other (non HMO) | Admitting: Family Medicine

## 2014-07-08 VITALS — BP 112/80 | HR 92 | Temp 98.3°F | Resp 19 | Ht 64.5 in | Wt 208.8 lb

## 2014-07-08 DIAGNOSIS — J4521 Mild intermittent asthma with (acute) exacerbation: Secondary | ICD-10-CM | POA: Diagnosis not present

## 2014-07-08 DIAGNOSIS — R05 Cough: Secondary | ICD-10-CM | POA: Diagnosis not present

## 2014-07-08 DIAGNOSIS — R059 Cough, unspecified: Secondary | ICD-10-CM

## 2014-07-08 MED ORDER — MOMETASONE FURO-FORMOTEROL FUM 100-5 MCG/ACT IN AERO: 2.0000 | INHALATION_SPRAY | Freq: Two times a day (BID) | RESPIRATORY_TRACT | Status: DC

## 2014-07-08 MED ORDER — AZITHROMYCIN 250 MG PO TABS: ORAL_TABLET | ORAL | Status: DC

## 2014-07-08 MED ORDER — HYDROCODONE-HOMATROPINE 5-1.5 MG/5ML PO SYRP: ORAL_SOLUTION | ORAL | Status: DC

## 2014-07-08 MED ORDER — PREDNISONE 20 MG PO TABS: 40.0000 mg | ORAL_TABLET | Freq: Every day | ORAL | Status: DC

## 2014-07-08 NOTE — Progress Notes (Signed)
° °  Subjective:    Patient ID: [name], female    DOB: 1965-05-21, 49 y.o.   MRN: [id number] This chart was scribed for Renee Haber, MD by Zola Button, Medical Scribe. This patient was seen in Room 14 and the patient's care was started at 9:34 AM.   HPI HPI Comments: [name] is a 49 y.o. female who presents to the [facility] complaining of gradual onset URI symptoms that started 1 week ago. Patient reports having cough and nasal congestion. She also had some sneezing, but that has since resolved. Her cough was dry initially, but it is now productive of dark yellow/green sputum. The cough is worse at night. She states her sinuses have been affected. Patient has tried Mucinex for her symptoms but without relief. She has had a hx of asthma since birth, for which she is currently on Qvar and Dulera. She also has albuterol inhaler and albuterol nebulizer solution to use as needed. She requests a refill for her Dulera.  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  Review of Systems  HENT: Positive for congestion. Negative for sneezing.   Respiratory: Positive for cough.        Objective:   Physical Exam CONSTITUTIONAL: Well developed/well nourished HEAD: Normocephalic/atraumatic EYES: EOM/PERRL ENMT: Mucous membranes moist NECK: supple no meningeal signs SPINE: entire spine nontender CV: S1/S2 noted, no murmurs/rubs/gallops noted LUNGS: Inspiratory and expiratory wheezes with decreased breath sounds bilaterally. ABDOMEN: soft, nontender, no rebound or guarding GU: no cva tenderness NEURO: Pt is awake/alert, moves all extremitiesx4 EXTREMITIES: pulses normal, full ROM SKIN: warm, color normal PSYCH: no abnormalities of mood noted    Assessment & Plan:   This chart was scribed in my presence and reviewed by me personally.    ICD-9-CM ICD-10-CM   1. Asthmatic bronchitis, mild intermittent, with acute exacerbation 493.92 J45.21  azithromycin (ZITHROMAX) 250 MG tablet     mometasone-formoterol (DULERA) 100-5 MCG/ACT AERO     predniSONE (DELTASONE) 20 MG tablet  2. Cough 786.2 R05 HYDROcodone-homatropine (HYCODAN) 5-1.5 MG/5ML syrup     Signed, Renee Haber, MD

## 2014-07-08 NOTE — Patient Instructions (Signed)

## 2014-08-03 ENCOUNTER — Encounter: Payer: Self-pay | Admitting: Certified Nurse Midwife

## 2014-08-03 ENCOUNTER — Ambulatory Visit (INDEPENDENT_AMBULATORY_CARE_PROVIDER_SITE_OTHER): Payer: Managed Care, Other (non HMO) | Admitting: Certified Nurse Midwife

## 2014-08-03 VITALS — BP 117/82 | HR 98 | Temp 98.2°F | Ht 64.0 in | Wt 208.0 lb

## 2014-08-03 DIAGNOSIS — L309 Dermatitis, unspecified: Secondary | ICD-10-CM

## 2014-08-03 DIAGNOSIS — Z01419 Encounter for gynecological examination (general) (routine) without abnormal findings: Secondary | ICD-10-CM | POA: Diagnosis not present

## 2014-08-03 MED ORDER — TRIAMCINOLONE ACETONIDE 0.5 % EX OINT: 1.0000 "application " | TOPICAL_OINTMENT | Freq: Two times a day (BID) | CUTANEOUS | Status: DC

## 2014-08-03 NOTE — Progress Notes (Signed)
Patient ID: [name], female   DOB: [birth year], 49 y.o.   MRN: [id number]   Subjective:2     [name] is a 49 y.o. female here for a routine exam.  Current complaints: irregular cycles with a cycle every two weeks occurrence about twice in 6 months.  Sexually active.  3 living children.  Chronic medical conditions: asthma.  Mother had BCA dx around late 25's-early 40's, living.  Starting to loose weight at weight loss clinic, has lost 25 lbs since January.    Employed.    Personal health questionnaire:  Is patient Ashkenazi Jewish, have a family history of breast and/or ovarian cancer: yes Is there a family history of uterine cancer diagnosed at age < 79, gastrointestinal cancer, urinary tract cancer, family member who is a Field seismologist syndrome-associated carrier: no Is the patient overweight and hypertensive, family history of diabetes, personal history of gestational diabetes, preeclampsia or PCOS: yes Is patient over 44, have PCOS,  family history of premature CHD under age 32, diabetes, smoke, have hypertension or peripheral artery disease:  no At any time, has a partner hit, kicked or otherwise hurt or frightened you?: no Over the past 2 weeks, have you felt down, depressed or hopeless?: no Over the past 2 weeks, have you felt little interest or pleasure in doing things?:no   Gynecologic History Patient's last menstrual period was 07/25/2014. Contraception: condoms Last Pap: unknown. Results were: normal according to the patient Last mammogram: 05/30/11. Results were: normal  Obstetric History OB History  Gravida Para Term Preterm AB SAB TAB Ectopic Multiple Living  6 3 3  3 3    3     # Outcome Date GA Lbr Len/2nd Weight Sex Delivery Anes PTL Lv  6 Term      Vag-Spont   Y  5 Term      Vag-Spont   Y  4 Term      Vag-Spont   Y  3 SAB      SAB     2 SAB      SAB     1 SAB      SAB         Past Medical History  Diagnosis Date  . Asthma     History reviewed. No  pertinent past surgical history.   Current outpatient prescriptions:  .  albuterol (PROVENTIL) (2.5 MG/3ML) 0.083% nebulizer solution, Take 2.5 mg by nebulization every 6 (six) hours as needed., Disp: , Rfl:  .  albuterol-ipratropium (COMBIVENT) 18-103 MCG/ACT inhaler, Inhale 2 puffs into the lungs every 6 (six) hours as needed., Disp: , Rfl:  .  beclomethasone (QVAR) 40 MCG/ACT inhaler, Inhale 2 puffs into the lungs 2 (two) times daily., Disp: , Rfl:  .  mometasone-formoterol (DULERA) 100-5 MCG/ACT AERO, Inhale 2 puffs into the lungs 2 (two) times daily., Disp: 1 Inhaler, Rfl: 11 .  phentermine (ADIPEX-P) 37.5 MG tablet, Take 37.5 mg by mouth daily before breakfast., Disp: , Rfl:  .  sulfamethoxazole-trimethoprim (BACTRIM DS,SEPTRA DS) 800-160 MG per tablet, Take 1 tablet by mouth once., Disp: , Rfl:  No Known Allergies  History  Substance Use Topics  . Smoking status: Never Smoker   . Smokeless tobacco: Never Used  . Alcohol Use: No    Family History  Problem Relation Age of Onset  . Diabetes Mother       Review of Systems  Constitutional: negative for fatigue and unintentional weight loss Respiratory: negative for cough and wheezing Cardiovascular: negative  for chest pain, fatigue and palpitations Gastrointestinal: negative for abdominal pain and change in bowel habits Musculoskeletal:negative for myalgias Neurological: negative for gait problems and tremors Behavioral/Psych: negative for abusive relationship, depression Endocrine: negative for temperature intolerance   Genitourinary:negative for abnormal menstrual periods, genital lesions, hot flashes, sexual problems and vaginal discharge Integument/breast: negative for breast lump, breast tenderness, nipple discharge and skin lesion(s).  Has patches of eczema.      Objective:       BP 117/82 mmHg  Pulse 98  Temp(Src) 98.2 F (36.8 C)  Ht 5\' 4"  (1.626 m)  Wt 94.348 kg (208 lb)  BMI 35.69 kg/m2  LMP  07/25/2014 General:   alert  Skin:   eczema  Lungs:   clear to auscultation bilaterally  Heart:   regular rate and rhythm, S1, S2 normal, no murmur, click, rub or gallop  Breasts:   normal without suspicious masses, skin or nipple changes or axillary nodes  Abdomen:  normal findings: no organomegaly, soft, non-tender and no hernia  Pelvis:  External genitalia: normal general appearance Urinary system: urethral meatus normal and bladder without fullness, nontender Vaginal: normal without tenderness, induration or masses Cervix: red appearance of cervical ectropion Adnexa: normal bimanual exam Uterus: anteverted and non-tender, normal size   Lab Review Urine pregnancy test Labs reviewed yes Radiologic studies reviewed yes  50% of 30 min visit spent on counseling and coordination of care.   Assessment:    Healthy female exam.   Obesity Contraception Counseling Eczema   Plan:    Education reviewed: calcium supplements, low fat, low cholesterol diet, safe sex/STD prevention, self breast exams, skin cancer screening and weight bearing exercise. Contraception: condoms. Mammogram ordered. Follow up in: 1 year.   Meds ordered this encounter  Medications  . phentermine (ADIPEX-P) 37.5 MG tablet    Sig: Take 37.5 mg by mouth daily before breakfast.  . sulfamethoxazole-trimethoprim (BACTRIM DS,SEPTRA DS) 800-160 MG per tablet    Sig: Take 1 tablet by mouth once.   No orders of the defined types were placed in this encounter.    Possible management options include: Nuva Ring was discussed as possible BC option, did not do well with Mirena (hair loss) and would forget to take OCPs.

## 2014-08-03 NOTE — Addendum Note (Signed)
Addended by: Lewie Loron D on: 08/03/2014 03:57 PM   Modules accepted: Orders

## 2014-08-06 LAB — SURESWAB, VAGINOSIS/VAGINITIS PLUS
Atopobium vaginae: NOT DETECTED Log (cells/mL)
C. GLABRATA, DNA: NOT DETECTED
C. PARAPSILOSIS, DNA: NOT DETECTED
C. TRACHOMATIS RNA, TMA: NOT DETECTED
C. albicans, DNA: NOT DETECTED
C. tropicalis, DNA: NOT DETECTED
GARDNERELLA VAGINALIS: NOT DETECTED Log (cells/mL)
LACTOBACILLUS SPECIES: NOT DETECTED Log (cells/mL)
MEGASPHAERA SPECIES: NOT DETECTED Log (cells/mL)
N. gonorrhoeae RNA, TMA: NOT DETECTED
T. VAGINALIS RNA, QL TMA: NOT DETECTED

## 2014-08-07 LAB — PAP IG AND HPV HIGH-RISK: HPV DNA High Risk: NOT DETECTED

## 2014-08-09 ENCOUNTER — Ambulatory Visit: Payer: Managed Care, Other (non HMO)

## 2014-08-11 ENCOUNTER — Ambulatory Visit (HOSPITAL_COMMUNITY)
Admission: RE | Admit: 2014-08-11 | Discharge: 2014-08-11 | Disposition: A | Discharge status: Home / Self Care | Payer: Managed Care, Other (non HMO) | Source: Ambulatory Visit | Attending: Certified Nurse Midwife | Admitting: Certified Nurse Midwife

## 2014-08-11 DIAGNOSIS — Z1231 Encounter for screening mammogram for malignant neoplasm of breast: Secondary | ICD-10-CM | POA: Insufficient documentation

## 2014-08-11 DIAGNOSIS — Z01419 Encounter for gynecological examination (general) (routine) without abnormal findings: Secondary | ICD-10-CM

## 2014-11-27 ENCOUNTER — Other Ambulatory Visit: Payer: Self-pay

## 2014-11-27 DIAGNOSIS — J4521 Mild intermittent asthma with (acute) exacerbation: Secondary | ICD-10-CM

## 2014-11-27 MED ORDER — MOMETASONE FURO-FORMOTEROL FUM 100-5 MCG/ACT IN AERO: 2.0000 | INHALATION_SPRAY | Freq: Two times a day (BID) | RESPIRATORY_TRACT | Status: DC

## 2015-03-19 ENCOUNTER — Ambulatory Visit (INDEPENDENT_AMBULATORY_CARE_PROVIDER_SITE_OTHER): Payer: Managed Care, Other (non HMO) | Admitting: Obstetrics

## 2015-03-19 VITALS — BP 125/80 | HR 89 | Wt 209.0 lb

## 2015-03-19 DIAGNOSIS — N9489 Other specified conditions associated with female genital organs and menstrual cycle: Secondary | ICD-10-CM | POA: Diagnosis not present

## 2015-03-19 DIAGNOSIS — N939 Abnormal uterine and vaginal bleeding, unspecified: Secondary | ICD-10-CM | POA: Diagnosis not present

## 2015-03-19 DIAGNOSIS — D251 Intramural leiomyoma of uterus: Secondary | ICD-10-CM

## 2015-03-19 DIAGNOSIS — R102 Pelvic and perineal pain: Secondary | ICD-10-CM | POA: Diagnosis not present

## 2015-03-23 ENCOUNTER — Encounter: Payer: Self-pay | Admitting: Obstetrics

## 2015-03-23 NOTE — Progress Notes (Signed)
Patient ID: [name], female   DOB: 02/06/1966, 49 y.o.   MRN: [id number]  Chief Complaint  Patient presents with  . Pelvic Pain    pain on side-sharp, cycle changes-every 2 weeks since summer    HPI [name] is a 49 y.o. female.  Pelvic pain and cycle changes every 2 weeks.  HPI  Past Medical History  Diagnosis Date  . Asthma     History reviewed. No pertinent past surgical history.  Family History  Problem Relation Age of Onset  . Diabetes Mother     Social History Social History  Substance Use Topics  . Smoking status: Never Smoker   . Smokeless tobacco: Never Used  . Alcohol Use: No    No Known Allergies  Current Outpatient Prescriptions  Medication Sig Dispense Refill  . albuterol (PROVENTIL) (2.5 MG/3ML) 0.083% nebulizer solution Take 2.5 mg by nebulization every 6 (six) hours as needed.    Marland Kitchen albuterol-ipratropium (COMBIVENT) 18-103 MCG/ACT inhaler Inhale 2 puffs into the lungs every 6 (six) hours as needed.    . beclomethasone (QVAR) 40 MCG/ACT inhaler Inhale 2 puffs into the lungs 2 (two) times daily.    . mometasone-formoterol (DULERA) 100-5 MCG/ACT AERO Inhale 2 puffs into the lungs 2 (two) times daily. 3 Inhaler 1  . phentermine (ADIPEX-P) 37.5 MG tablet Take 37.5 mg by mouth daily before breakfast.    . sulfamethoxazole-trimethoprim (BACTRIM DS,SEPTRA DS) 800-160 MG per tablet Take 1 tablet by mouth once.    . triamcinolone ointment (KENALOG) 0.5 % Apply 1 application topically 2 (two) times daily. 30 g 2   No current facility-administered medications for this visit.    Review of Systems Review of Systems Constitutional: negative for fatigue and weight loss Respiratory: negative for cough and wheezing Cardiovascular: negative for chest pain, fatigue and palpitations Gastrointestinal: negative for abdominal pain and change in bowel habits Genitourinary: positive for pelvic pain and irregular cycles Integument/breast: negative for  nipple discharge Musculoskeletal:negative for myalgias Neurological: negative for gait problems and tremors Behavioral/Psych: negative for abusive relationship, depression Endocrine: negative for temperature intolerance     Blood pressure 125/80, pulse 89, weight 209 lb (94.802 kg), last menstrual period 03/11/2015.  Physical Exam Physical Exam General:   alert  Skin:   no rash or abnormalities  Lungs:   clear to auscultation bilaterally  Heart:   regular rate and rhythm, S1, S2 normal, no murmur, click, rub or gallop  Breasts:   normal without suspicious masses, skin or nipple changes or axillary nodes  Abdomen:  normal findings: no organomegaly, soft, non-tender and no hernia  Pelvis:  External genitalia: normal general appearance Urinary system: urethral meatus normal and bladder without fullness, nontender Vaginal: normal without tenderness, induration or masses Cervix: normal appearance Adnexa: normal bimanual exam Uterus: anteverted and non-tender, normal size      Data Reviewed Labs  Assessment     Pelvic pain.  Normal exam.  AUB    Plan    Wet prep and cultures sent. Ultrasound ordered F/U in 2 weeks.   Orders Placed This Encounter  Procedures  . SureSwab, Vaginosis/Vaginitis Plus  . US Transvaginal Non-OB    Standing Status: Future     Number of Occurrences:      Standing Expiration Date: 05/18/2016    Order Specific Question:  Reason for Exam (SYMPTOM  OR DIAGNOSIS REQUIRED)    Answer:  Fibroids    Order Specific Question:  Preferred imaging location?    Answer:  [facility]  . US Pelvis Complete    Standing Status: Future     Number of Occurrences:      Standing Expiration Date: 05/18/2016    Order Specific Question:  Reason for Exam (SYMPTOM  OR DIAGNOSIS REQUIRED)    Answer:  Fibroids    Order Specific Question:  Preferred imaging location?    Answer:  [facility]   No orders of the defined types were placed in this encounter.

## 2015-03-24 LAB — SURESWAB, VAGINOSIS/VAGINITIS PLUS
Atopobium vaginae: NOT DETECTED Log (cells/mL)
C. PARAPSILOSIS, DNA: NOT DETECTED
C. albicans, DNA: NOT DETECTED
C. glabrata, DNA: NOT DETECTED
C. trachomatis RNA, TMA: NOT DETECTED
C. tropicalis, DNA: NOT DETECTED
Gardnerella vaginalis: <4.7 Log (cells/mL)
LACTOBACILLUS SPECIES: NOT DETECTED Log (cells/mL)
MEGASPHAERA SPECIES: NOT DETECTED Log (cells/mL)
N. gonorrhoeae RNA, TMA: NOT DETECTED
T. VAGINALIS RNA, QL TMA: NOT DETECTED

## 2015-03-26 ENCOUNTER — Ambulatory Visit (HOSPITAL_COMMUNITY): Payer: Managed Care, Other (non HMO)

## 2015-03-26 ENCOUNTER — Telehealth: Payer: Self-pay

## 2015-03-26 NOTE — Telephone Encounter (Signed)
Called patient to let her know she was covered at 100% per [place], for ultrasound here in our office [facility]) - noted in guarantor snapshot

## 2015-04-02 ENCOUNTER — Ambulatory Visit: Payer: Managed Care, Other (non HMO) | Admitting: Obstetrics

## 2015-04-05 ENCOUNTER — Ambulatory Visit (INDEPENDENT_AMBULATORY_CARE_PROVIDER_SITE_OTHER): Payer: Managed Care, Other (non HMO)

## 2015-04-05 ENCOUNTER — Ambulatory Visit (INDEPENDENT_AMBULATORY_CARE_PROVIDER_SITE_OTHER): Payer: Managed Care, Other (non HMO) | Admitting: Obstetrics

## 2015-04-05 ENCOUNTER — Encounter: Payer: Self-pay | Admitting: Obstetrics

## 2015-04-05 ENCOUNTER — Other Ambulatory Visit: Payer: Self-pay | Admitting: Obstetrics

## 2015-04-05 VITALS — BP 128/82 | HR 81 | Wt 217.0 lb

## 2015-04-05 DIAGNOSIS — R102 Pelvic and perineal pain: Secondary | ICD-10-CM | POA: Diagnosis not present

## 2015-04-05 DIAGNOSIS — N9489 Other specified conditions associated with female genital organs and menstrual cycle: Secondary | ICD-10-CM

## 2015-04-05 DIAGNOSIS — D251 Intramural leiomyoma of uterus: Secondary | ICD-10-CM

## 2015-04-05 DIAGNOSIS — N939 Abnormal uterine and vaginal bleeding, unspecified: Secondary | ICD-10-CM

## 2015-04-05 NOTE — Progress Notes (Signed)
Patient ID: [name], female   DOB: [birth year], 49 y.o.   MRN: [id number]  Chief Complaint  Patient presents with  . Follow-up    u/s results.  pt still having pelvic pain/pressure. pain radiating down legs. irregular cycles.     HPI [name] is a 49 y.o. female.  C/O worsening pelvic pain and pressure.  Also having periods every 2 weeks.  These symptoms have gotten progressively.   HPI  Past Medical History  Diagnosis Date  . Asthma     No past surgical history on file.  Family History  Problem Relation Age of Onset  . Diabetes Mother     Social History Social History  Substance Use Topics  . Smoking status: Never Smoker   . Smokeless tobacco: Never Used  . Alcohol Use: No    No Known Allergies  Current Outpatient Prescriptions  Medication Sig Dispense Refill  . albuterol (PROVENTIL) (2.5 MG/3ML) 0.083% nebulizer solution Take 2.5 mg by nebulization every 6 (six) hours as needed.    Marland Kitchen albuterol-ipratropium (COMBIVENT) 18-103 MCG/ACT inhaler Inhale 2 puffs into the lungs every 6 (six) hours as needed.    . beclomethasone (QVAR) 40 MCG/ACT inhaler Inhale 2 puffs into the lungs 2 (two) times daily.    . mometasone-formoterol (DULERA) 100-5 MCG/ACT AERO Inhale 2 puffs into the lungs 2 (two) times daily. 3 Inhaler 1  . phentermine (ADIPEX-P) 37.5 MG tablet Take 37.5 mg by mouth daily before breakfast.    . sulfamethoxazole-trimethoprim (BACTRIM DS,SEPTRA DS) 800-160 MG per tablet Take 1 tablet by mouth once.    . triamcinolone ointment (KENALOG) 0.5 % Apply 1 application topically 2 (two) times daily. 30 g 2   No current facility-administered medications for this visit.    Review of Systems Review of Systems Constitutional: negative for fatigue and weight loss Respiratory: negative for cough and wheezing Cardiovascular: negative for chest pain, fatigue and palpitations Gastrointestinal: negative for abdominal pain and change in bowel  habits Genitourinary:positive for pelvic pain and pressure, and periods every 2 weeks. Integument/breast: negative for nipple discharge Musculoskeletal:negative for myalgias Neurological: negative for gait problems and tremors Behavioral/Psych: negative for abusive relationship, depression Endocrine: negative for temperature intolerance     Blood pressure 128/82, pulse 81, weight 217 lb (98.431 kg), last menstrual period 03/11/2015.  Physical Exam Physical Exam General:   alert  Skin:   no rash or abnormalities  Lungs:   clear to auscultation bilaterally  Heart:   regular rate and rhythm, S1, S2 normal, no murmur, click, rub or gallop  Breasts:   normal without suspicious masses, skin or nipple changes or axillary nodes  Abdomen:  normal findings: no organomegaly, soft, non-tender and no hernia  Pelvis:  External genitalia: normal general appearance Urinary system: urethral meatus normal and bladder without fullness, nontender Vaginal: normal without tenderness, induration or masses Cervix: normal appearance Adnexa: normal bimanual exam Uterus: anteverted and tender, normal size      Data Reviewed Ultrasound:  Uterus and ovaries normal with one small intramural fibroid and cystic features in myometrium that may be c/w adenomyosis. Wet Prep: WNL's  Assessment     Chronic Pelvic Pain AUB - possible Adenomyosis vs. Pelvic Floor Disorder     Plan    Referred to Urogynecology.  Orders Placed This Encounter  Procedures  . Ambulatory referral to Urogynecology    Referral Priority:  Routine    Referral Type:  Consultation    Referral Reason:  Specialty Services Required  Requested Specialty:  Urology    Number of Visits Requested:  1   No orders of the defined types were placed in this encounter.

## 2015-04-30 ENCOUNTER — Ambulatory Visit (INDEPENDENT_AMBULATORY_CARE_PROVIDER_SITE_OTHER): Payer: Managed Care, Other (non HMO) | Admitting: Emergency Medicine

## 2015-04-30 ENCOUNTER — Ambulatory Visit (INDEPENDENT_AMBULATORY_CARE_PROVIDER_SITE_OTHER): Payer: Managed Care, Other (non HMO)

## 2015-04-30 VITALS — BP 126/78 | HR 83 | Temp 98.5°F | Resp 16 | Ht 64.0 in | Wt 213.6 lb

## 2015-04-30 DIAGNOSIS — M7741 Metatarsalgia, right foot: Secondary | ICD-10-CM

## 2015-04-30 DIAGNOSIS — M79671 Pain in right foot: Secondary | ICD-10-CM | POA: Diagnosis not present

## 2015-04-30 MED ORDER — NAPROXEN SODIUM 550 MG PO TABS: 550.0000 mg | ORAL_TABLET | Freq: Two times a day (BID) | ORAL | Status: DC

## 2015-04-30 NOTE — Progress Notes (Signed)
Subjective:  Patient ID: [name], female    DOB: January 25, 1966  Age: 50 y.o. MRN: [id number]  CC: Foot Pain   HPI [name] presents   Patient will woke up yesterday and got out of bed and found that she had marked pain and tenderness worse with weightbearing and ambulation in the third toe metatarsal phalangeal joint. She has no history of injury or overuse. Pain is been persistent since that time. She has no swelling or erythema no history of  gout.  History Sunjai has a past medical history of Asthma.   She has past surgical history that includes Hernia repair.   Her  family history includes Diabetes in her mother.  She   reports that she has never smoked. She has never used smokeless tobacco. She reports that she does not drink alcohol or use illicit drugs.  Outpatient Prescriptions Prior to Visit  Medication Sig Dispense Refill  . albuterol (PROVENTIL) (2.5 MG/3ML) 0.083% nebulizer solution Take 2.5 mg by nebulization every 6 (six) hours as needed.    Marland Kitchen albuterol-ipratropium (COMBIVENT) 18-103 MCG/ACT inhaler Inhale 2 puffs into the lungs every 6 (six) hours as needed.    . beclomethasone (QVAR) 40 MCG/ACT inhaler Inhale 2 puffs into the lungs 2 (two) times daily.    . mometasone-formoterol (DULERA) 100-5 MCG/ACT AERO Inhale 2 puffs into the lungs 2 (two) times daily. 3 Inhaler 1  . sulfamethoxazole-trimethoprim (BACTRIM DS,SEPTRA DS) 800-160 MG per tablet Take 1 tablet by mouth once. Reported on 04/30/2015    . phentermine (ADIPEX-P) 37.5 MG tablet Take 37.5 mg by mouth daily before breakfast. Reported on 04/30/2015    . triamcinolone ointment (KENALOG) 0.5 % Apply 1 application topically 2 (two) times daily. (Patient not taking: Reported on 04/30/2015) 30 g 2   No facility-administered medications prior to visit.    Social History   Social History  . Marital Status: Married    Spouse Name: N/A  . Number of Children: N/A  . Years of Education: N/A    Social History Main Topics  . Smoking status: Never Smoker   . Smokeless tobacco: Never Used  . Alcohol Use: No  . Drug Use: No  . Sexual Activity:    Partners: Male    Birth Control/ Protection: Condom   Other Topics Concern  . None   Social History Narrative     Review of Systems  Constitutional: Negative for fever, chills and appetite change.  HENT: Negative for congestion, ear pain, postnasal drip, sinus pressure and sore throat.   Eyes: Negative for pain and redness.  Respiratory: Negative for cough, shortness of breath and wheezing.   Cardiovascular: Negative for leg swelling.  Gastrointestinal: Negative for nausea, vomiting, abdominal pain, diarrhea, constipation and blood in stool.  Endocrine: Negative for polyuria.  Genitourinary: Negative for dysuria, urgency, frequency and flank pain.  Musculoskeletal: Negative for gait problem.  Skin: Negative for rash.  Neurological: Negative for weakness and headaches.  Psychiatric/Behavioral: Negative for confusion and decreased concentration. The patient is not nervous/anxious.     Objective:  BP 126/78 mmHg  Pulse 83  Temp(Src) 98.5 F (36.9 C) (Oral)  Resp 16  Ht 5\' 4"  (1.626 m)  Wt 213 lb 9.6 oz (96.888 kg)  BMI 36.65 kg/m2  SpO2 99%  LMP 04/06/2015 (Exact Date)  Physical Exam  Constitutional: She is oriented to person, place, and time. She appears well-developed and well-nourished.  HENT:  Head: Normocephalic and atraumatic.  Eyes: Conjunctivae are normal.  Pupils are equal, round, and reactive to light.  Pulmonary/Chest: Effort normal.  Musculoskeletal: She exhibits no edema.       Right foot: There is tenderness. There is normal range of motion and no deformity.  Neurological: She is alert and oriented to person, place, and time.  Skin: Skin is dry.  Psychiatric: She has a normal mood and affect. Her behavior is normal. Thought content normal.      Assessment & Plan:   Jerlene was seen today for foot  pain.  Diagnoses and all orders for this visit:  Right foot pain -     DG Foot Complete Right; Future  Metatarsalgia of right foot -     Ambulatory referral to Podiatry  Other orders -     naproxen sodium (ANAPROX DS) 550 MG tablet; Take 1 tablet (550 mg total) by mouth 2 (two) times daily with a meal.  I am having Ms. [name] start on naproxen sodium. I am also having her maintain her beclomethasone, albuterol, albuterol-ipratropium, phentermine, sulfamethoxazole-trimethoprim, triamcinolone ointment, and mometasone-formoterol.  Meds ordered this encounter  Medications  . naproxen sodium (ANAPROX DS) 550 MG tablet    Sig: Take 1 tablet (550 mg total) by mouth 2 (two) times daily with a meal.    Dispense:  40 tablet    Refill:  0    Appropriate red flag conditions were discussed with the patient as well as actions that should be taken.  Patient expressed his understanding.  Follow-up: Return if symptoms worsen or fail to improve.  Roselee Culver, MD   [facility] reading (PRIMARY) by  Dr. Ouida Sills.  negative.

## 2015-11-16 ENCOUNTER — Other Ambulatory Visit: Payer: Self-pay | Admitting: Obstetrics

## 2015-11-16 DIAGNOSIS — Z1231 Encounter for screening mammogram for malignant neoplasm of breast: Secondary | ICD-10-CM

## 2015-11-27 ENCOUNTER — Ambulatory Visit
Admission: RE | Admit: 2015-11-27 | Discharge: 2015-11-27 | Disposition: A | Discharge status: Home / Self Care | Payer: Managed Care, Other (non HMO) | Source: Ambulatory Visit | Attending: Obstetrics | Admitting: Obstetrics

## 2015-11-27 DIAGNOSIS — Z1231 Encounter for screening mammogram for malignant neoplasm of breast: Secondary | ICD-10-CM

## 2016-04-23 ENCOUNTER — Ambulatory Visit (INDEPENDENT_AMBULATORY_CARE_PROVIDER_SITE_OTHER): Payer: BLUE CROSS/BLUE SHIELD | Admitting: Family Medicine

## 2016-04-23 DIAGNOSIS — J45901 Unspecified asthma with (acute) exacerbation: Secondary | ICD-10-CM

## 2016-04-23 DIAGNOSIS — J069 Acute upper respiratory infection, unspecified: Secondary | ICD-10-CM

## 2016-04-23 DIAGNOSIS — J209 Acute bronchitis, unspecified: Secondary | ICD-10-CM | POA: Insufficient documentation

## 2016-04-23 MED ORDER — AMOXICILLIN 875 MG PO TABS: 875.0000 mg | ORAL_TABLET | Freq: Two times a day (BID) | ORAL | 0 refills | Status: DC

## 2016-04-23 MED ORDER — BENZONATATE 100 MG PO CAPS: 100.0000 mg | ORAL_CAPSULE | Freq: Three times a day (TID) | ORAL | 0 refills | Status: DC | PRN

## 2016-04-23 MED ORDER — PREDNISONE 20 MG PO TABS: ORAL_TABLET | ORAL | 0 refills | Status: DC

## 2016-04-23 NOTE — Progress Notes (Signed)
Patient ID: [name], female    DOB: 02/05/1966  Age: 50 y.o. MRN: [id number]  Chief Complaint  Patient presents with  . chest congestion    X 3 days  . Cough    Subjective:   74 year young lady who is here with a history of about 5 days ago getting a upper respiratory infection with some headache congestion. It progressed on and through the weekend she developed more cough with wheezing. She brings up more purulent phlegm green. She does not smoke. She does have a history of asthma and is on chronic medications including do LARA and Qvar. She has albuterol nebulization which she has been using, and it helps her so she has not taken it today. She has not had ear problems. She did have a sore throat which is improved. She has some head congestion. She feels bad. She is supposed to return to work this week. Her husband and nail a week ago, no numbness in the home places until  Current allergies, medications, problem list, past/family and social histories reviewed.  Objective:  BP 126/76 (BP Location: Right Arm, Patient Position: Sitting, Cuff Size: Large)   Pulse 87   Temp 98.5 F (36.9 C) (Oral)   Resp 16   Ht 5\' 4"  (1.626 m)   Wt 215 lb (97.5 kg)   LMP 04/09/2016 (Approximate)   SpO2 97%   BMI 36.90 kg/m   No major acute distress. Her TMs are normal. Throat not erythematous. Neck supple without significant nodes. Chest has scattered wheezing and rhonchi and prolonged expiratory time phase to her breathing. No GI complaints. She has not had a flu shot, is not against getting them to just 10 gotten around to it yet and then got sick.  Assessment & Plan:   Assessment: 1. Moderate asthma with acute exacerbation, unspecified whether persistent   2. Acute upper respiratory infection   3. Acute bronchitis, unspecified organism       Plan: Will go ahead and treat her. What she is doing better in a few days she should go ahead and get her flu shot somewhere  No orders of the  defined types were placed in this encounter.   Meds ordered this encounter  Medications  . amoxicillin (AMOXIL) 875 MG tablet    Sig: Take 1 tablet (875 mg total) by mouth 2 (two) times daily.    Dispense:  14 tablet    Refill:  0  . predniSONE (DELTASONE) 20 MG tablet    Sig: Take 3 daily for 2 days, then 2 daily for 2 days, then 1 daily for 2 days.  Take after morning meal    Dispense:  12 tablet    Refill:  0  . benzonatate (TESSALON) 100 MG capsule    Sig: Take 1-2 capsules (100-200 mg total) by mouth 3 (three) times daily as needed.    Dispense:  30 capsule    Refill:  0         Patient Instructions    Drink plenty of fluids and get enough rest  Take amoxicillin 875 mg one twice daily  Use the benzonatate cough pills one or 2 pills 3 times daily for daytime cough. You can take this without oversedation.   Continue using your inhalers and nebulizers.  Take the prednisone 20 mg 3 daily for 2 days, then 2 daily for 2 days, then 1 daily for 2 days. Recommend taking after breakfast.  Return if worse or go to  the emergency room in the event of acute difficulty breathing.  When you're feeling a little better I would recommend going ahead and getting the flu shot.    IF you received an x-ray today, you will receive an invoice from [facility]. Please contact [facility] at [phone] with questions or concerns regarding your invoice.   IF you received labwork today, you will receive an invoice from [place]. Please contact [facility] at [phone] with questions or concerns regarding your invoice.   Our billing staff will not be able to assist you with questions regarding bills from these companies.  You will be contacted with the lab results as soon as they are available. The fastest way to get your results is to activate your My Chart account. Instructions are located on the last page of this paperwork. If you have not heard from [place] regarding the  results in 2 weeks, please contact this office.         Return if symptoms worsen or fail to improve.   Shatavia Santor, MD 04/23/2016

## 2016-04-23 NOTE — Patient Instructions (Addendum)
  Drink plenty of fluids and get enough rest  Take amoxicillin 875 mg one twice daily  Use the benzonatate cough pills one or 2 pills 3 times daily for daytime cough. You can take this without oversedation.   Continue using your inhalers and nebulizers.  Take the prednisone 20 mg 3 daily for 2 days, then 2 daily for 2 days, then 1 daily for 2 days. Recommend taking after breakfast.  Return if worse or go to the emergency room in the event of acute difficulty breathing.  When you're feeling a little better I would recommend going ahead and getting the flu shot.    IF you received an x-ray today, you will receive an invoice from [facility]. Please contact [facility] at [phone] with questions or concerns regarding your invoice.   IF you received labwork today, you will receive an invoice from [place]. Please contact [facility] at [phone] with questions or concerns regarding your invoice.   Our billing staff will not be able to assist you with questions regarding bills from these companies.  You will be contacted with the lab results as soon as they are available. The fastest way to get your results is to activate your My Chart account. Instructions are located on the last page of this paperwork. If you have not heard from [place] regarding the results in 2 weeks, please contact this office.

## 2017-03-31 ENCOUNTER — Encounter: Payer: Self-pay | Admitting: Obstetrics

## 2017-03-31 ENCOUNTER — Other Ambulatory Visit: Payer: Self-pay

## 2017-03-31 ENCOUNTER — Ambulatory Visit (INDEPENDENT_AMBULATORY_CARE_PROVIDER_SITE_OTHER): Payer: BLUE CROSS/BLUE SHIELD | Admitting: Obstetrics

## 2017-03-31 VITALS — BP 123/77 | HR 72 | Ht 64.0 in | Wt 226.6 lb

## 2017-03-31 DIAGNOSIS — Z124 Encounter for screening for malignant neoplasm of cervix: Secondary | ICD-10-CM

## 2017-03-31 DIAGNOSIS — D251 Intramural leiomyoma of uterus: Secondary | ICD-10-CM

## 2017-03-31 DIAGNOSIS — Z1151 Encounter for screening for human papillomavirus (HPV): Secondary | ICD-10-CM

## 2017-03-31 DIAGNOSIS — Z01419 Encounter for gynecological examination (general) (routine) without abnormal findings: Secondary | ICD-10-CM | POA: Diagnosis not present

## 2017-03-31 DIAGNOSIS — N939 Abnormal uterine and vaginal bleeding, unspecified: Secondary | ICD-10-CM | POA: Diagnosis not present

## 2017-03-31 DIAGNOSIS — R102 Pelvic and perineal pain: Secondary | ICD-10-CM

## 2017-03-31 DIAGNOSIS — Z113 Encounter for screening for infections with a predominantly sexual mode of transmission: Secondary | ICD-10-CM

## 2017-03-31 MED ORDER — MEDROXYPROGESTERONE ACETATE 10 MG PO TABS: 10.0000 mg | ORAL_TABLET | Freq: Every day | ORAL | 0 refills | Status: DC

## 2017-03-31 NOTE — Progress Notes (Signed)
Subjective:        [name] is a 51 y.o. female here for a routine exam.  Current complaints: Periods every 2 weeks.  Pelvic pressure.  Has history of uterine fibroids.    Personal health questionnaire:  Is patient Ashkenazi Jewish, have a family history of breast and/or ovarian cancer: yes Is there a family history of uterine cancer diagnosed at age < 18, gastrointestinal cancer, urinary tract cancer, family member who is a Field seismologist syndrome-associated carrier: no Is the patient overweight and hypertensive, family history of diabetes, personal history of gestational diabetes, preeclampsia or PCOS: no Is patient over 24, have PCOS,  family history of premature CHD under age 47, diabetes, smoke, have hypertension or peripheral artery disease:  no At any time, has a partner hit, kicked or otherwise hurt or frightened you?: no Over the past 2 weeks, have you felt down, depressed or hopeless?: no Over the past 2 weeks, have you felt little interest or pleasure in doing things?:no   Gynecologic History Patient's last menstrual period was 03/24/2017 (exact date). Contraception: none Last Pap: 2016. Results were: ASCUS with negative HPV Last mammogram: 2017. Results were: normal  Obstetric History OB History  Gravida Para Term Preterm AB Living  6 3 3   3 3   SAB TAB Ectopic Multiple Live Births  3       3    # Outcome Date GA Lbr Len/2nd Weight Sex Delivery Anes PTL Lv  6 Term      Vag-Spont   LIV  5 Term      Vag-Spont   LIV  4 Term      Vag-Spont   LIV  3 SAB      SAB     2 SAB      SAB     1 SAB      SAB         Past Medical History:  Diagnosis Date  . Asthma     Past Surgical History:  Procedure Laterality Date  . HERNIA REPAIR       Current Outpatient Medications:  .  albuterol (PROVENTIL) (2.5 MG/3ML) 0.083% nebulizer solution, Take 2.5 mg by nebulization every 6 (six) hours as needed., Disp: , Rfl:  .  albuterol-ipratropium (COMBIVENT) 18-103 MCG/ACT  inhaler, Inhale 2 puffs into the lungs every 6 (six) hours as needed., Disp: , Rfl:  .  amoxicillin (AMOXIL) 875 MG tablet, Take 1 tablet (875 mg total) by mouth 2 (two) times daily. (Patient not taking: Reported on 03/31/2017), Disp: 14 tablet, Rfl: 0 .  beclomethasone (QVAR) 40 MCG/ACT inhaler, Inhale 2 puffs into the lungs 2 (two) times daily., Disp: , Rfl:  .  benzonatate (TESSALON) 100 MG capsule, Take 1-2 capsules (100-200 mg total) by mouth 3 (three) times daily as needed. (Patient not taking: Reported on 03/31/2017), Disp: 30 capsule, Rfl: 0 .  mometasone-formoterol (DULERA) 100-5 MCG/ACT AERO, Inhale 2 puffs into the lungs 2 (two) times daily., Disp: 3 Inhaler, Rfl: 1 .  predniSONE (DELTASONE) 20 MG tablet, Take 3 daily for 2 days, then 2 daily for 2 days, then 1 daily for 2 days.  Take after morning meal (Patient not taking: Reported on 03/31/2017), Disp: 12 tablet, Rfl: 0 .  triamcinolone ointment (KENALOG) 0.5 %, Apply 1 application topically 2 (two) times daily., Disp: 30 g, Rfl: 2 No Known Allergies  Social History   Tobacco Use  . Smoking status: Never Smoker  . Smokeless tobacco: Never Used  Substance Use Topics  . Alcohol use: No    Alcohol/week: 0.0 oz    Family History  Problem Relation Age of Onset  . Diabetes Mother       Review of Systems  Constitutional: negative for fatigue and weight loss Respiratory: negative for cough and wheezing Cardiovascular: negative for chest pain, fatigue and palpitations Gastrointestinal: negative for abdominal pain and change in bowel habits Musculoskeletal:negative for myalgias Neurological: negative for gait problems and tremors Behavioral/Psych: negative for abusive relationship, depression Endocrine: negative for temperature intolerance    Genitourinary:positive for abnormal menstrual periods and pelvic pressure Integument/breast: negative for breast lump, breast tenderness, nipple discharge and skin lesion(s)    Objective:        BP 123/77   Pulse 72   Ht 5\' 4"  (1.626 m)   Wt 226 lb 9.6 oz (102.8 kg)   LMP 03/24/2017 (Exact Date)   BMI 38.90 kg/m  General:   alert  Skin:   no rash or abnormalities  Lungs:   clear to auscultation bilaterally  Heart:   regular rate and rhythm, S1, S2 normal, no murmur, click, rub or gallop  Breasts:   normal without suspicious masses, skin or nipple changes or axillary nodes  Abdomen:  normal findings: no organomegaly, soft, non-tender and no hernia  Pelvis:  External genitalia: normal general appearance Urinary system: urethral meatus normal and bladder without fullness, nontender Vaginal: normal without tenderness, induration or masses Cervix: normal appearance.  Bled easily with pap Adnexa: normal bimanual exam Uterus: anteverted and non-tender, normal size   Lab Review Urine pregnancy test Labs reviewed yes Radiologic studies reviewed yes  50% of 20 min visit spent on counseling and coordination of care.   Assessment:     1. Encounter for routine gynecological examination with Papanicolaou smear of cervix Rx: - Cytology - PAP  2. Fibroids, intramural - stable on exam  3. Pelvic pressure in female - feels like something is " pushing down "   4. Abnormal uterine bleeding (AUB) Rx: - [place] Sonohysterogram; Future - medroxyPROGESTERone (PROVERA) 10 MG tablet; Take 1 tablet (10 mg total) by mouth daily.  Dispense: 10 tablet; Refill: 0   Plan:    Education reviewed: calcium supplements, depression evaluation, low fat, low cholesterol diet, safe sex/STD prevention, self breast exams and weight bearing exercise. Follow up in: 8 weeks.   No orders of the defined types were placed in this encounter.  No orders of the defined types were placed in this encounter.

## 2017-03-31 NOTE — Progress Notes (Signed)
Presents for AEX.  Having periods every two weeks  X 4 days.  Complains of pressure 4/10.

## 2017-04-01 NOTE — Addendum Note (Signed)
Addended by: Shelly Bombard on: 04/01/2017 08:27 AM   Modules accepted: Orders

## 2017-04-02 ENCOUNTER — Other Ambulatory Visit: Payer: Self-pay | Admitting: Obstetrics

## 2017-04-02 LAB — CYTOLOGY - PAP
Diagnosis: NEGATIVE
HPV: NOT DETECTED

## 2017-04-02 LAB — CERVICOVAGINAL ANCILLARY ONLY
Bacterial vaginitis: NEGATIVE
CHLAMYDIA, DNA PROBE: NEGATIVE
Candida vaginitis: NEGATIVE
NEISSERIA GONORRHEA: NEGATIVE
TRICH (WINDOWPATH): NEGATIVE

## 2017-05-20 ENCOUNTER — Ambulatory Visit (HOSPITAL_COMMUNITY)
Admission: RE | Admit: 2017-05-20 | Discharge: 2017-05-20 | Disposition: A | Discharge status: Home / Self Care | Payer: BLUE CROSS/BLUE SHIELD | Source: Ambulatory Visit | Attending: Obstetrics | Admitting: Obstetrics

## 2017-05-20 ENCOUNTER — Other Ambulatory Visit: Payer: Self-pay

## 2017-05-20 DIAGNOSIS — R198 Other specified symptoms and signs involving the digestive system and abdomen: Secondary | ICD-10-CM

## 2017-05-20 DIAGNOSIS — N939 Abnormal uterine and vaginal bleeding, unspecified: Secondary | ICD-10-CM

## 2017-05-20 DIAGNOSIS — D259 Leiomyoma of uterus, unspecified: Secondary | ICD-10-CM | POA: Insufficient documentation

## 2017-10-03 ENCOUNTER — Emergency Department (HOSPITAL_COMMUNITY): Payer: BLUE CROSS/BLUE SHIELD

## 2017-10-03 ENCOUNTER — Emergency Department (HOSPITAL_COMMUNITY)
Admission: EM | Admit: 2017-10-03 | Discharge: 2017-10-03 | Disposition: A | Discharge status: Home / Self Care | Payer: BLUE CROSS/BLUE SHIELD | Attending: Emergency Medicine | Admitting: Emergency Medicine

## 2017-10-03 ENCOUNTER — Encounter (HOSPITAL_COMMUNITY): Payer: Self-pay | Admitting: Emergency Medicine

## 2017-10-03 DIAGNOSIS — S199XXA Unspecified injury of neck, initial encounter: Secondary | ICD-10-CM | POA: Diagnosis present

## 2017-10-03 DIAGNOSIS — Y999 Unspecified external cause status: Secondary | ICD-10-CM | POA: Diagnosis not present

## 2017-10-03 DIAGNOSIS — J45909 Unspecified asthma, uncomplicated: Secondary | ICD-10-CM | POA: Diagnosis not present

## 2017-10-03 DIAGNOSIS — S39012A Strain of muscle, fascia and tendon of lower back, initial encounter: Secondary | ICD-10-CM | POA: Insufficient documentation

## 2017-10-03 DIAGNOSIS — Y929 Unspecified place or not applicable: Secondary | ICD-10-CM | POA: Diagnosis not present

## 2017-10-03 DIAGNOSIS — Z79899 Other long term (current) drug therapy: Secondary | ICD-10-CM | POA: Insufficient documentation

## 2017-10-03 DIAGNOSIS — S161XXA Strain of muscle, fascia and tendon at neck level, initial encounter: Secondary | ICD-10-CM | POA: Insufficient documentation

## 2017-10-03 DIAGNOSIS — Y939 Activity, unspecified: Secondary | ICD-10-CM | POA: Diagnosis not present

## 2017-10-03 MED ORDER — METHOCARBAMOL 500 MG PO TABS: 1000.0000 mg | ORAL_TABLET | Freq: Two times a day (BID) | ORAL | 0 refills | Status: DC
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
## 2017-10-03 NOTE — Discharge Instructions (Signed)
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
## 2017-10-03 NOTE — ED Notes (Signed)
Patient transported to X-ray 

## 2017-10-03 NOTE — ED Notes (Signed)
Bed: WTR7 Expected date:  Expected time:  Means of arrival:  Comments: 

## 2018-07-04 IMAGING — US US TRANSVAGINAL NON-OB
1 series · 15 of 25 positions shown · non-contrast
Comparison: 03/13/2011

CLINICAL DATA: Abnormal uterine bleeding.



[Series 1: us transvaginal non-ob · 15 of 81 slices shown]
[im 1/81]
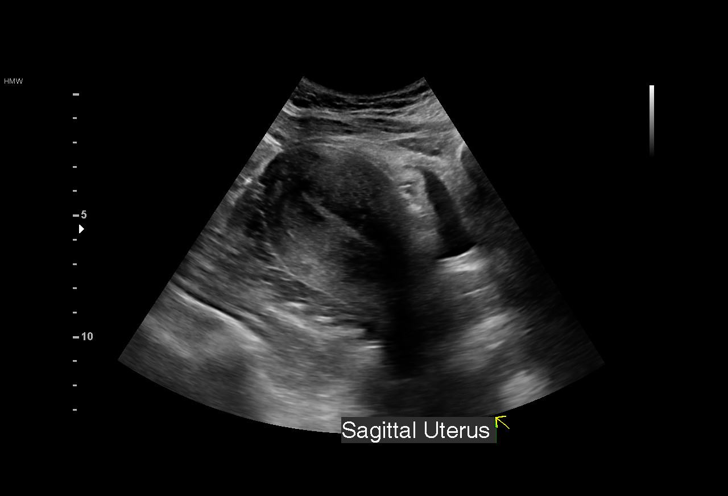
[im 7/81]
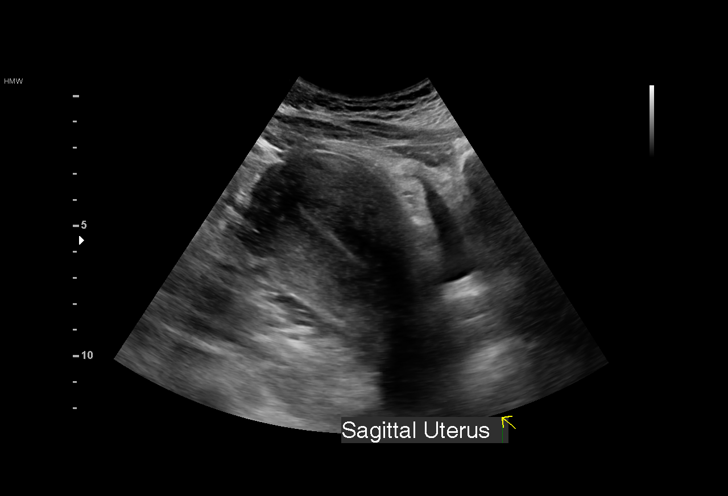
[im 14/81]
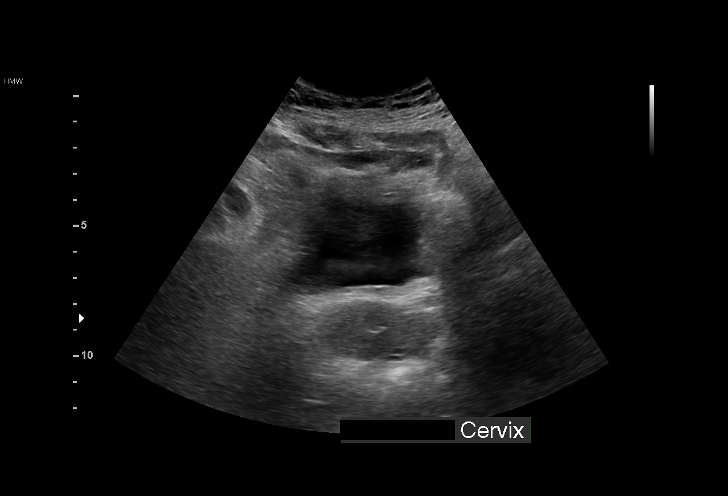
[im 17/81]
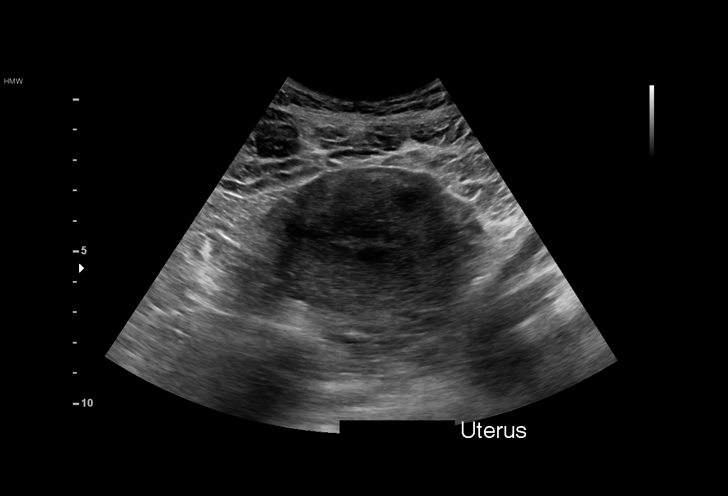
[im 24/81]
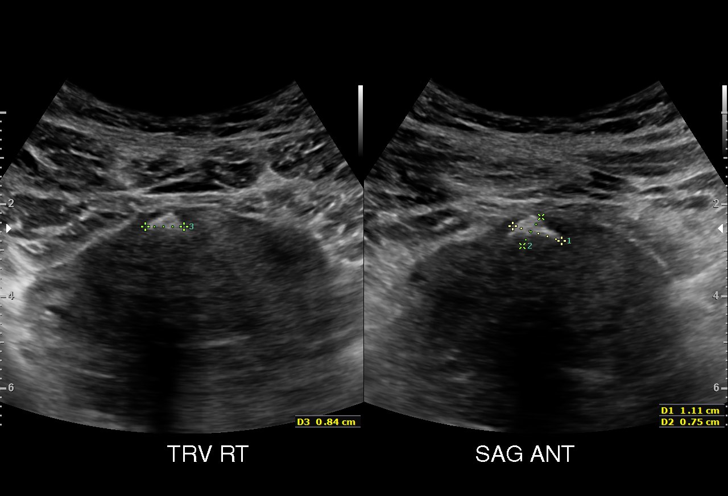
[im 31/81]
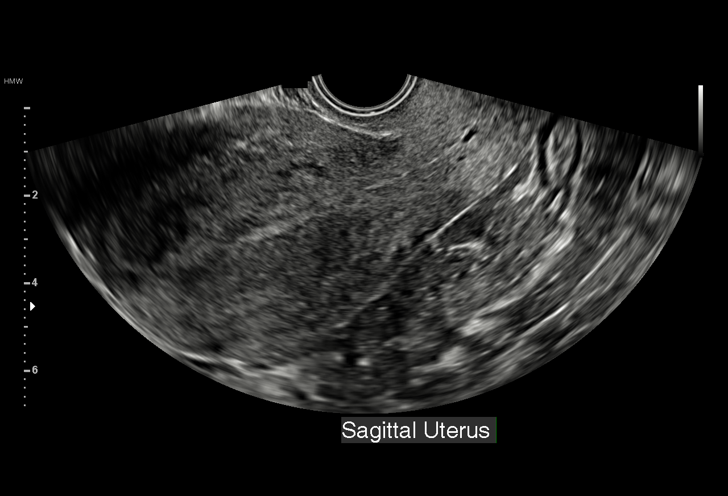
[im 34/81]
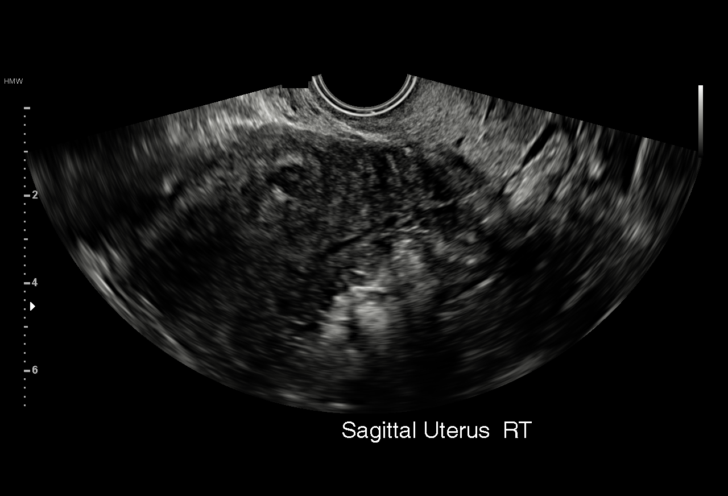
[im 41/81]
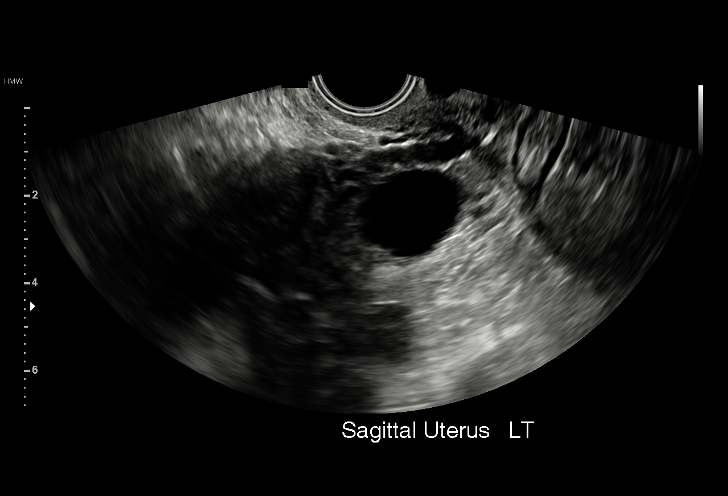
[im 47/81]
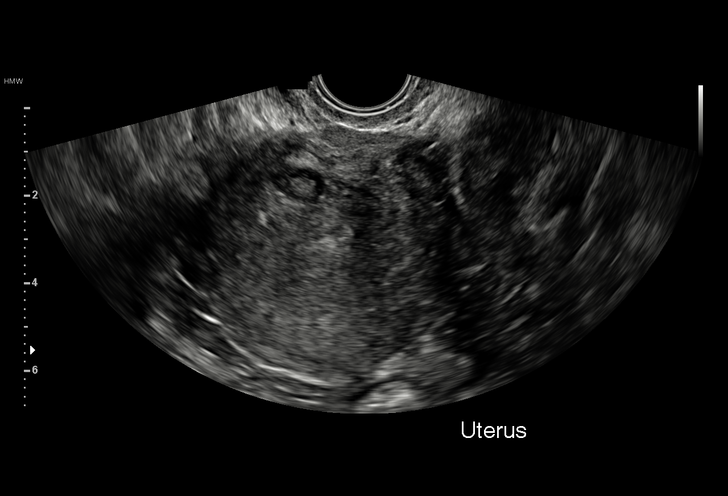
[im 51/81]
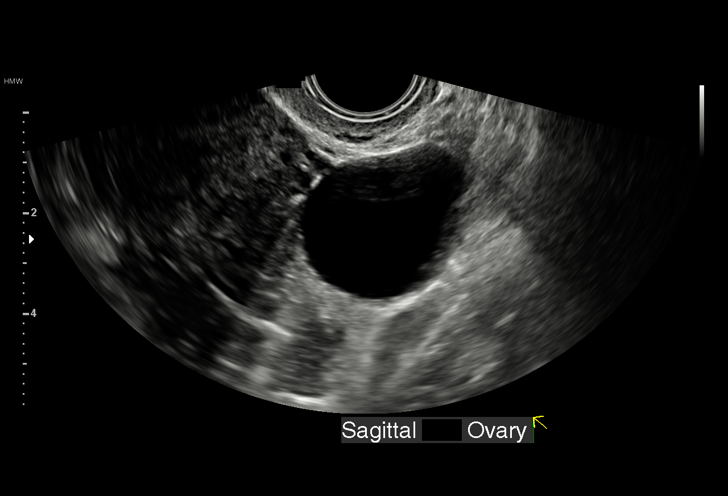
[im 57/81]
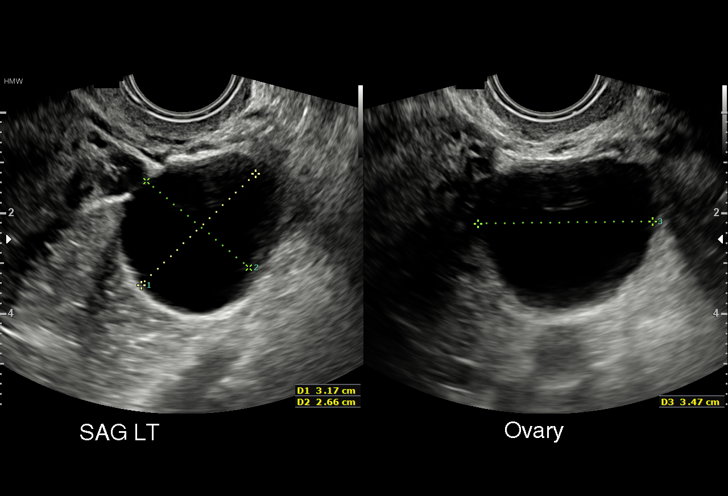
[im 64/81]
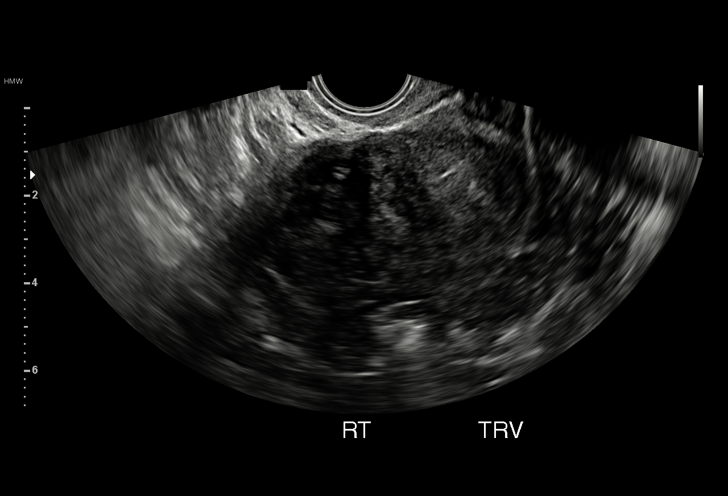
[im 67/81]
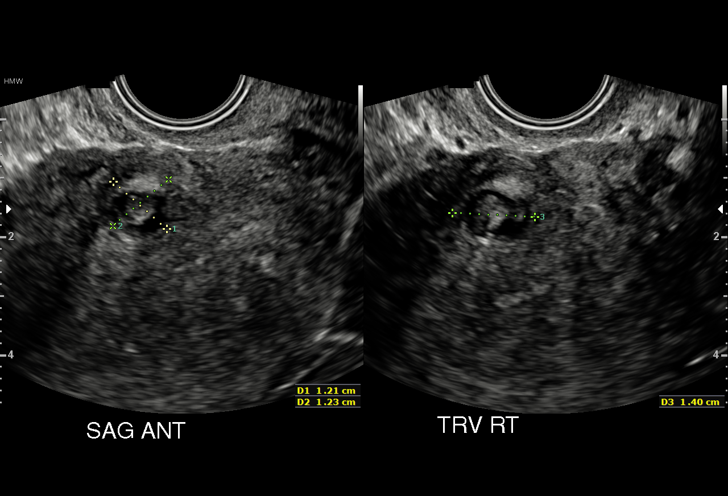
[im 74/81]
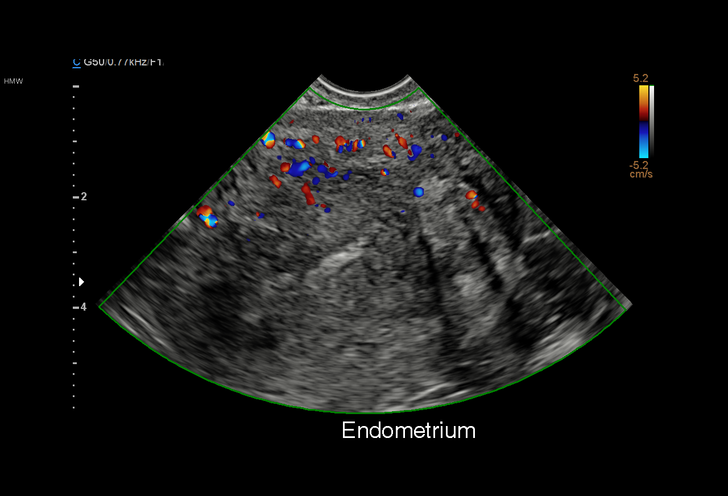
[im 81/81]
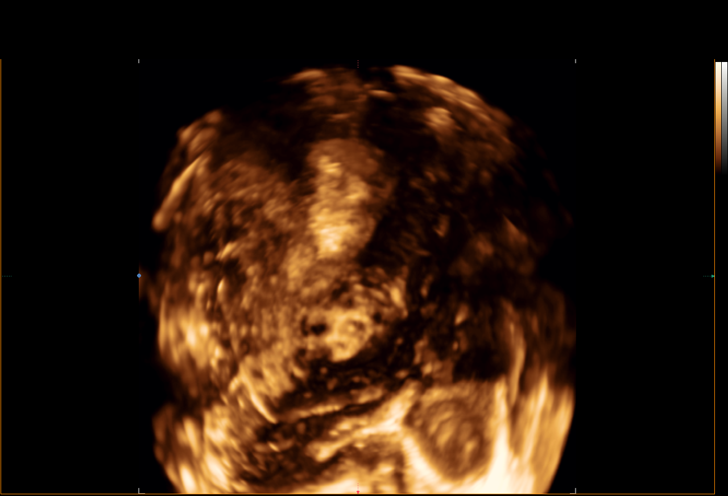

[15 of 25 positions shown; findings below may reference images not displayed]

FINDINGS: Uterus

Measurements: 10.3 x 5.7 x 5.7 cm. Small fibroids scattered
throughout the uterus, the largest posteriorly measuring up to
cm.

Endometrium

Thickness: 6 mm in thickness.  No focal abnormality visualized.

Right ovary

Measurements: 2.7 x 1.9 x 2.2 cm. Normal appearance/no adnexal mass.

Left ovary

Measurements: 3.8 x 3.0 x 3.6 cm. 3.5 cm simple appearing cyst in
the left ovary.

Other findings

No abnormal free fluid.
IMPRESSION: Small scattered uterine fibroids (at least 3), the largest 2.2 cm
posteriorly.

No endometrial thickening or visible focal endometrial abnormality.
Given the fact that the endometrium appears normal and the fact that
the patient was bleeding at today's appointment, sonohysterogram was
not performed. If bleeding remains unresponsive to hormonal or
medical therapy, sonohysterogram should be considered for focal
lesion work-up. If possible, the patient should be not bleeding at
the time of the study. (Ref: Radiological Reasoning: Algorithmic
Workup of Abnormal Vaginal Bleeding with Endovaginal Sonography and
Sonohysterography. AJR [id number]; 191:S68-73)
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
## 2020-02-28 ENCOUNTER — Other Ambulatory Visit: Payer: Self-pay

## 2020-02-28 ENCOUNTER — Ambulatory Visit (INDEPENDENT_AMBULATORY_CARE_PROVIDER_SITE_OTHER): Payer: BC Managed Care – PPO | Admitting: Obstetrics

## 2020-02-28 ENCOUNTER — Other Ambulatory Visit (HOSPITAL_COMMUNITY)
Admission: RE | Admit: 2020-02-28 | Discharge: 2020-02-28 | Disposition: A | Discharge status: Home / Self Care | Payer: BC Managed Care – PPO | Source: Ambulatory Visit | Attending: Obstetrics | Admitting: Obstetrics

## 2020-02-28 ENCOUNTER — Encounter: Payer: Self-pay | Admitting: Obstetrics

## 2020-02-28 VITALS — BP 121/75 | HR 83 | Ht 64.0 in | Wt 218.0 lb

## 2020-02-28 DIAGNOSIS — Z853 Personal history of malignant neoplasm of breast: Secondary | ICD-10-CM | POA: Diagnosis not present

## 2020-02-28 DIAGNOSIS — E669 Obesity, unspecified: Secondary | ICD-10-CM

## 2020-02-28 DIAGNOSIS — Z01419 Encounter for gynecological examination (general) (routine) without abnormal findings: Secondary | ICD-10-CM

## 2020-02-28 DIAGNOSIS — N951 Menopausal and female climacteric states: Secondary | ICD-10-CM | POA: Diagnosis not present

## 2020-02-28 DIAGNOSIS — Z1239 Encounter for other screening for malignant neoplasm of breast: Secondary | ICD-10-CM

## 2020-02-28 NOTE — Progress Notes (Signed)
Subjective:        [name] is a 54 y.o. female here for a routine exam.  Current complaints: None.    Personal health questionnaire:  Is patient Ashkenazi Jewish, have a family history of breast and/or ovarian cancer: no Is there a family history of uterine cancer diagnosed at age < 101, gastrointestinal cancer, urinary tract cancer, family member who is a Field seismologist syndrome-associated carrier: no Is the patient overweight and hypertensive, family history of diabetes, personal history of gestational diabetes, preeclampsia or PCOS: no Is patient over 60, have PCOS,  family history of premature CHD under age 26, diabetes, smoke, have hypertension or peripheral artery disease:  no At any time, has a partner hit, kicked or otherwise hurt or frightened you?: no Over the past 2 weeks, have you felt down, depressed or hopeless?: no Over the past 2 weeks, have you felt little interest or pleasure in doing things?:no   Gynecologic History No LMP recorded. Contraception: condoms Last Pap: 2018. Results were: normal Last mammogram: 2021. Results were: abnormal  Obstetric History OB History  Gravida Para Term Preterm AB Living  6 3 3   3 3   SAB TAB Ectopic Multiple Live Births  3       3    # Outcome Date GA Lbr Len/2nd Weight Sex Delivery Anes PTL Lv  6 Term      Vag-Spont   LIV  5 Term      Vag-Spont   LIV  4 Term      Vag-Spont   LIV  3 SAB      SAB     2 SAB      SAB     1 SAB      SAB       Past Medical History:  Diagnosis Date  . Asthma     Past Surgical History:  Procedure Laterality Date  . DILATION AND CURETTAGE, DIAGNOSTIC / THERAPEUTIC    . HERNIA REPAIR       Current Outpatient Medications:  .  ergocalciferol (VITAMIN D2) 1.25 MG (50000 UT) capsule, Take by mouth., Disp: , Rfl:  .  mometasone-formoterol (DULERA) 100-5 MCG/ACT AERO, Inhale 2 puffs into the lungs 2 (two) times daily., Disp: 3 Inhaler, Rfl: 1 .  spironolactone (ALDACTONE) 50 MG tablet, Take  50 mg by mouth daily., Disp: , Rfl:  .  albuterol (PROVENTIL) (2.5 MG/3ML) 0.083% nebulizer solution, Take 2.5 mg by nebulization every 6 (six) hours as needed. (Patient not taking: Reported on 02/28/2020), Disp: , Rfl:  .  albuterol-ipratropium (COMBIVENT) 18-103 MCG/ACT inhaler, Inhale 2 puffs into the lungs every 6 (six) hours as needed. (Patient not taking: Reported on 02/28/2020), Disp: , Rfl:  .  amoxicillin (AMOXIL) 875 MG tablet, Take 1 tablet (875 mg total) by mouth 2 (two) times daily. (Patient not taking: Reported on 03/31/2017), Disp: 14 tablet, Rfl: 0 .  beclomethasone (QVAR) 40 MCG/ACT inhaler, Inhale 2 puffs into the lungs 2 (two) times daily. (Patient not taking: Reported on 02/28/2020), Disp: , Rfl:  .  benzonatate (TESSALON) 100 MG capsule, Take 1-2 capsules (100-200 mg total) by mouth 3 (three) times daily as needed. (Patient not taking: Reported on 03/31/2017), Disp: 30 capsule, Rfl: 0 .  medroxyPROGESTERone (PROVERA) 10 MG tablet, Take 1 tablet (10 mg total) by mouth daily. (Patient not taking: Reported on 02/28/2020), Disp: 10 tablet, Rfl: 0 .  methocarbamol (ROBAXIN) 500 MG tablet, Take 2 tablets (1,000 mg total) by mouth 2 (  two) times daily., Disp: 8 tablet, Rfl: 0 .  predniSONE (DELTASONE) 20 MG tablet, Take 3 daily for 2 days, then 2 daily for 2 days, then 1 daily for 2 days.  Take after morning meal (Patient not taking: Reported on 03/31/2017), Disp: 12 tablet, Rfl: 0 .  triamcinolone ointment (KENALOG) 0.5 %, Apply 1 application topically 2 (two) times daily. (Patient not taking: Reported on 02/28/2020), Disp: 30 g, Rfl: 2 No Known Allergies  Social History   Tobacco Use  . Smoking status: Never Smoker  . Smokeless tobacco: Never Used  Substance Use Topics  . Alcohol use: No    Alcohol/week: 0.0 standard drinks    Family History  Problem Relation Age of Onset  . Diabetes Mother       Review of Systems  Constitutional: negative for fatigue and weight loss Respiratory:  negative for cough and wheezing Cardiovascular: negative for chest pain, fatigue and palpitations Gastrointestinal: negative for abdominal pain and change in bowel habits Musculoskeletal:negative for myalgias Neurological: negative for gait problems and tremors Behavioral/Psych: negative for abusive relationship, depression Endocrine: negative for temperature intolerance    Genitourinary:negative for abnormal menstrual periods, genital lesions, hot flashes, sexual problems and vaginal discharge Integument/breast: negative for breast lump, breast tenderness, nipple discharge and skin lesion(s)    Objective:       BP 121/75   Pulse 83   Ht 5\' 4"  (1.626 m)   Wt 218 lb (98.9 kg)   BMI 37.42 kg/m  General:   alert  Skin:   no rash or abnormalities  Lungs:   clear to auscultation bilaterally  Heart:   regular rate and rhythm, S1, S2 normal, no murmur, click, rub or gallop  Breasts:   normal without suspicious masses, skin or nipple changes or axillary nodes  Abdomen:  normal findings: no organomegaly, soft, non-tender and no hernia  Pelvis:  External genitalia: normal general appearance Urinary system: urethral meatus normal and bladder without fullness, nontender Vaginal: normal without tenderness, induration or masses Cervix: normal appearance Adnexa: normal bimanual exam Uterus: anteverted and non-tender, normal size   Lab Review Urine pregnancy test Labs reviewed yes Radiologic studies reviewed yes  50% of 20 min visit spent on counseling and coordination of care.   Assessment:     1. Encounter for routine gynecological examination with Papanicolaou smear of cervix Rx: - Cytology - PAP( [facility])  2. History of breast cancer - infiltrating ductal carcinoma of left breast - status post lumpectomy, radiation and Tamoxifen  3. Screening breast examination Rx: - MM Digital Screening; Future  4. 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 Mammogram ordered. Follow up in: 1 year.    Orders Placed This Encounter  Procedures  . MM Digital Screening    Standing Status:   Future    Standing Expiration Date:   02/26/2021    Order Specific Question:   Reason for Exam (SYMPTOM  OR DIAGNOSIS REQUIRED)    Answer:   Screening    Order Specific Question:   Is the patient pregnant?    Answer:   No    Order Specific Question:   Preferred imaging location?    Answer:   [facility]    Shelly Bombard, MD 02/28/2020  4:17 PM

## 2020-02-28 NOTE — Progress Notes (Signed)
GYN presents for AEX/PAP. Reports no problems today. Last PAP 03/31/2017  PHQ-9=3

## 2020-03-06 LAB — CYTOLOGY - PAP
Comment: NEGATIVE
Diagnosis: UNDETERMINED — AB
High risk HPV: NEGATIVE

## 2020-03-07 ENCOUNTER — Telehealth: Payer: Self-pay

## 2020-03-07 NOTE — Telephone Encounter (Signed)
-----   Message from Shelly Bombard, MD sent at 03/07/2020  8:00 AM EST ----- ASCUS with negative HRHPV.  Repeat pap in 1 year.

## 2020-03-07 NOTE — Telephone Encounter (Signed)
Patient has been informed of test results and will follow up next year for repeat pap.

## 2020-11-15 ENCOUNTER — Ambulatory Visit (INDEPENDENT_AMBULATORY_CARE_PROVIDER_SITE_OTHER): Payer: Self-pay | Admitting: Internal Medicine

## 2020-11-15 ENCOUNTER — Encounter: Payer: Self-pay | Admitting: Internal Medicine

## 2020-11-15 ENCOUNTER — Other Ambulatory Visit: Payer: Self-pay

## 2020-11-15 DIAGNOSIS — R739 Hyperglycemia, unspecified: Secondary | ICD-10-CM | POA: Insufficient documentation

## 2020-11-15 DIAGNOSIS — J45901 Unspecified asthma with (acute) exacerbation: Secondary | ICD-10-CM

## 2020-11-15 DIAGNOSIS — E669 Obesity, unspecified: Secondary | ICD-10-CM

## 2020-11-15 DIAGNOSIS — J4521 Mild intermittent asthma with (acute) exacerbation: Secondary | ICD-10-CM

## 2020-11-15 LAB — POCT GLYCOSYLATED HEMOGLOBIN (HGB A1C): Hemoglobin A1C: 5.2 % (ref 4.0–5.6)

## 2020-11-15 LAB — GLUCOSE, CAPILLARY: Glucose-Capillary: 99 mg/dL (ref 70–99)

## 2020-11-15 MED ORDER — ALBUTEROL SULFATE HFA 108 (90 BASE) MCG/ACT IN AERS: 2.0000 | INHALATION_SPRAY | Freq: Four times a day (QID) | RESPIRATORY_TRACT | 2 refills | Status: AC | PRN

## 2020-11-15 MED ORDER — ALBUTEROL SULFATE (2.5 MG/3ML) 0.083% IN NEBU: 2.5000 mg | INHALATION_SOLUTION | Freq: Four times a day (QID) | RESPIRATORY_TRACT | 0 refills | Status: DC | PRN

## 2020-11-15 MED ORDER — MOMETASONE FURO-FORMOTEROL FUM 100-5 MCG/ACT IN AERO: 2.0000 | INHALATION_SPRAY | Freq: Two times a day (BID) | RESPIRATORY_TRACT | 1 refills | Status: AC

## 2020-11-15 NOTE — Progress Notes (Signed)
CC: establish care, Asthma exacerbation  HPI:  [name] is a 55 y.o. female with a past medical history stated below and presents today for asthma exacerbation and establish care.  [name] presents today to meet her new PCP.  She denies fevers, chills, abdominal pain, N/V, constipation, diarrhea, leg cramping or swelling.  She admits to chest tightness and shortness of breath upon exertion.  She also notes wheezing.  [name] states that if she climbs stairs 2-3 times she suddenly becomes short of breath and requires her inhaler.  She is exercise intolerant due to SOB upon exertion.  [name] reports recent weight gain.  She is currently not on a diet, and reports that she eats what ever she wants.  She denies drinking sodas, however, she drinks lots of juice such as lemonade and sweet tea.  She states that she snacks throughout the day with chocolate and candies.  She states that she does not exercise due to her asthma and hot weather.  She also reports LMP July 2022.  Previously she had no menses for 9 months, and her cycle resumed June 2022.  She reports menopausal symptoms such as hot flashes daily and night sweats occasionally.  [name] states she has a history of breast cancer.  She states that she has been cancer free for 1 year.  She takes tamoxifen 20 mg daily.   Please see problem based assessment and plan for additional details.  Past Medical History:  Diagnosis Date   Asthma     Current Outpatient Medications on File Prior to Visit  Medication Sig Dispense Refill   ergocalciferol (VITAMIN D2) 1.25 MG (50000 UT) capsule Take by mouth.     No current facility-administered medications on file prior to visit.    Family History  Problem Relation Age of Onset   Diabetes Mother     Social History   Socioeconomic History   Marital status: Married    Spouse name: Not on file   Number of children: Not on file   Years of education: Not  on file   Highest education level: Not on file  Occupational History   Not on file  Tobacco Use   Smoking status: Never   Smokeless tobacco: Never  Vaping Use   Vaping Use: Never used  Substance and Sexual Activity   Alcohol use: No    Alcohol/week: 0.0 standard drinks   Drug use: No   Sexual activity: Yes    Partners: Male    Birth control/protection: Condom  Other Topics Concern   Not on file  Social History Narrative   Not on file   Social Determinants of Health   Financial Resource Strain: Not on file  Food Insecurity: Not on file  Transportation Needs: Not on file  Physical Activity: Not on file  Stress: Not on file  Social Connections: Not on file  Intimate Partner Violence: Not on file    Review of Systems  Constitutional:  Negative for chills and fever.  Eyes:  Negative for blurred vision.  Respiratory:  Positive for shortness of breath and wheezing.   Cardiovascular:  Positive for chest pain and palpitations. Negative for leg swelling.  Gastrointestinal:  Negative for abdominal pain, constipation, diarrhea, heartburn, nausea and vomiting.  Genitourinary:  Negative for dysuria, frequency and urgency.  Neurological:  Negative for dizziness and headaches.   Vitals:   11/15/20 0935  BP: 119/76  Pulse: 75  Temp: 98.4 F (36.9 C)  TempSrc:  Oral  SpO2: 95%  Weight: 224 lb 4.8 oz (101.7 kg)  Height: 5\' 4"  (1.626 m)     Physical Exam Constitutional:      Appearance: Normal appearance. She is obese.  HENT:     Head: Normocephalic and atraumatic.  Cardiovascular:     Rate and Rhythm: Normal rate and regular rhythm.     Heart sounds: Normal heart sounds, S1 normal and S2 normal.  Pulmonary:     Effort: Pulmonary effort is normal.     Breath sounds: Examination of the right-upper field reveals wheezing. Examination of the left-middle field reveals wheezing. Wheezing present. No decreased breath sounds.  Abdominal:     General: Bowel sounds are normal.      Palpations: Abdomen is soft.     Tenderness: There is no abdominal tenderness. There is no guarding.  Skin:    General: Skin is warm and dry.  Neurological:     General: No focal deficit present.     Mental Status: She is alert.  Psychiatric:        Attention and Perception: Attention normal.        Speech: Speech normal.        Behavior: Behavior normal. Behavior is cooperative.      Assessment & Plan:   See Encounters Tab for problem based charting.  Patient seen with Dr. Blenda Bridegroom, M.D. [facility], PGY-1 Pager: [phone], Phone: [phone] Date 11/15/2020 Time 11:11 AM

## 2020-11-15 NOTE — Assessment & Plan Note (Signed)
Ms. Oscarson reports chest tightness and shortness of breath and wheezing upon exertion.  Which requires use of her inhaler.  She reports wheezing.  Physical exam noted for wheezing on auscultation of lungs bilaterally.  Ms. Sipe states she has a nebulizer at home.   PLAN: Refilled Dulera prescription. Prescribed albuterol rescue inhaler as needed. Discussed tolerable exercise regimens. Follow-up in 3 months to discuss asthma with persistent symptoms persist, reassess prescriptions.

## 2020-11-15 NOTE — Assessment & Plan Note (Signed)
Outside labs conducted February 2022, reveals hyperglycemia with glucose level at 114.  No record of hemoglobin A1c.  [name] admits to poor diet.  Admits to eating chocolates and candies regularly.  Drinks lots of lemonade and sweet tea, she reports.   PLAN: Hemoglobin A1c checked today. Discussed diet. Information attached to AVS concerning calorie counting for weight loss Follow-up in 3 months

## 2020-11-15 NOTE — Assessment & Plan Note (Addendum)
[name] current BMI is 38.5.  She admits to recent weight gain due to poor diet.  She denies history of being diagnosed with diabetes or hypertension.  She reports exercise intolerant due to her asthma.  We discussed diet and tolerable exercise regimens.  Outside records show CMP (February 2022) abnormal, hyperglycemia at glucose level 114.   PLAN: Lipid profile ordered today, to assess cholesterol levels and LDL. BMP ordered today to assess kidney function Hemoglobin A1c ordered today, none on file. Follow-up in 3 months

## 2020-11-16 LAB — BMP8+ANION GAP
Anion Gap: 16 mmol/L (ref 10.0–18.0)
BUN/Creatinine Ratio: 8 — ABNORMAL LOW (ref 9–23)
BUN: 8 mg/dL (ref 6–24)
CO2: 21 mmol/L (ref 20–29)
Calcium: 9.7 mg/dL (ref 8.7–10.2)
Chloride: 103 mmol/L (ref 96–106)
Creatinine, Ser: 0.99 mg/dL (ref 0.57–1.00)
Glucose: 94 mg/dL (ref 65–99)
Potassium: 3.9 mmol/L (ref 3.5–5.2)
Sodium: 140 mmol/L (ref 134–144)
eGFR: 68 mL/min/{1.73_m2} (ref >59)

## 2020-11-16 LAB — LIPID PANEL
Chol/HDL Ratio: 2.4 ratio (ref 0.0–4.4)
Cholesterol, Total: 195 mg/dL (ref 100–199)
HDL: 82 mg/dL (ref >39)
LDL Chol Calc (NIH): 94 mg/dL (ref 0–99)
Triglycerides: 110 mg/dL (ref 0–149)
VLDL Cholesterol Cal: 19 mg/dL (ref 5–40)
